# Patient Record
Sex: Male | Born: 1960 | Race: White | Hispanic: No | Marital: Married
Health system: Midwestern US, Academic
[De-identification: ages and names within clinical notes are randomized; demographics above are authoritative.]

---

## 2018-01-01 ENCOUNTER — Encounter: Admit: 2018-01-01 | Discharge: 2018-01-02 | Payer: BC Managed Care – PPO

## 2018-02-19 ENCOUNTER — Encounter: Admit: 2018-02-19 | Discharge: 2018-02-19 | Payer: BC Managed Care – PPO

## 2018-02-19 DIAGNOSIS — M109 Gout, unspecified: ICD-10-CM

## 2018-02-19 DIAGNOSIS — L719 Rosacea, unspecified: ICD-10-CM

## 2018-02-19 DIAGNOSIS — I2510 Atherosclerotic heart disease of native coronary artery without angina pectoris: Principal | ICD-10-CM

## 2018-02-19 DIAGNOSIS — I10 Essential (primary) hypertension: ICD-10-CM

## 2018-02-19 DIAGNOSIS — E781 Pure hyperglyceridemia: ICD-10-CM

## 2018-02-19 DIAGNOSIS — M549 Dorsalgia, unspecified: Principal | ICD-10-CM

## 2018-02-24 ENCOUNTER — Encounter: Admit: 2018-02-24 | Discharge: 2018-02-24 | Payer: BC Managed Care – PPO

## 2018-02-24 DIAGNOSIS — M109 Gout, unspecified: ICD-10-CM

## 2018-02-24 DIAGNOSIS — E781 Pure hyperglyceridemia: ICD-10-CM

## 2018-02-24 DIAGNOSIS — I10 Essential (primary) hypertension: ICD-10-CM

## 2018-02-24 DIAGNOSIS — L719 Rosacea, unspecified: ICD-10-CM

## 2018-02-24 DIAGNOSIS — I2510 Atherosclerotic heart disease of native coronary artery without angina pectoris: Principal | ICD-10-CM

## 2018-02-25 ENCOUNTER — Ambulatory Visit: Admit: 2018-02-25 | Discharge: 2018-02-25 | Payer: BC Managed Care – PPO

## 2018-02-25 ENCOUNTER — Encounter: Admit: 2018-02-25 | Discharge: 2018-02-25 | Payer: BC Managed Care – PPO

## 2018-02-25 DIAGNOSIS — M5416 Radiculopathy, lumbar region: ICD-10-CM

## 2018-02-25 DIAGNOSIS — M549 Dorsalgia, unspecified: Principal | ICD-10-CM

## 2018-02-25 DIAGNOSIS — M109 Gout, unspecified: ICD-10-CM

## 2018-02-25 DIAGNOSIS — Z9889 Other specified postprocedural states: ICD-10-CM

## 2018-02-25 DIAGNOSIS — I10 Essential (primary) hypertension: ICD-10-CM

## 2018-02-25 DIAGNOSIS — M4800 Spinal stenosis, site unspecified: ICD-10-CM

## 2018-02-25 DIAGNOSIS — L719 Rosacea, unspecified: ICD-10-CM

## 2018-02-25 DIAGNOSIS — I219 Acute myocardial infarction, unspecified: ICD-10-CM

## 2018-02-25 DIAGNOSIS — M5136 Other intervertebral disc degeneration, lumbar region: ICD-10-CM

## 2018-02-25 DIAGNOSIS — M5030 Other cervical disc degeneration, unspecified cervical region: ICD-10-CM

## 2018-02-25 DIAGNOSIS — I2510 Atherosclerotic heart disease of native coronary artery without angina pectoris: Principal | ICD-10-CM

## 2018-02-25 DIAGNOSIS — E781 Pure hyperglyceridemia: ICD-10-CM

## 2018-02-25 DIAGNOSIS — M48062 Spinal stenosis, lumbar region with neurogenic claudication: ICD-10-CM

## 2018-02-25 DIAGNOSIS — M2550 Pain in unspecified joint: ICD-10-CM

## 2018-04-13 ENCOUNTER — Ambulatory Visit: Admit: 2018-04-13 | Discharge: 2018-04-14 | Payer: BC Managed Care – PPO

## 2018-04-13 ENCOUNTER — Encounter: Admit: 2018-04-13 | Discharge: 2018-04-13 | Payer: BC Managed Care – PPO

## 2018-04-13 DIAGNOSIS — E781 Pure hyperglyceridemia: ICD-10-CM

## 2018-04-13 DIAGNOSIS — M5030 Other cervical disc degeneration, unspecified cervical region: ICD-10-CM

## 2018-04-13 DIAGNOSIS — I10 Essential (primary) hypertension: ICD-10-CM

## 2018-04-13 DIAGNOSIS — M2550 Pain in unspecified joint: ICD-10-CM

## 2018-04-13 DIAGNOSIS — M4800 Spinal stenosis, site unspecified: ICD-10-CM

## 2018-04-13 DIAGNOSIS — L719 Rosacea, unspecified: ICD-10-CM

## 2018-04-13 DIAGNOSIS — M109 Gout, unspecified: ICD-10-CM

## 2018-04-13 DIAGNOSIS — M5136 Other intervertebral disc degeneration, lumbar region: ICD-10-CM

## 2018-04-13 DIAGNOSIS — I219 Acute myocardial infarction, unspecified: ICD-10-CM

## 2018-04-13 DIAGNOSIS — I2510 Atherosclerotic heart disease of native coronary artery without angina pectoris: Principal | ICD-10-CM

## 2018-04-14 DIAGNOSIS — M48062 Spinal stenosis, lumbar region with neurogenic claudication: ICD-10-CM

## 2018-04-14 DIAGNOSIS — Z9889 Other specified postprocedural states: Principal | ICD-10-CM

## 2018-04-14 DIAGNOSIS — M109 Gout, unspecified: ICD-10-CM

## 2018-04-14 DIAGNOSIS — M5416 Radiculopathy, lumbar region: ICD-10-CM

## 2021-06-08 ENCOUNTER — Encounter: Admit: 2021-06-08 | Discharge: 2021-06-08 | Payer: BC Managed Care – PPO

## 2021-06-11 ENCOUNTER — Encounter: Admit: 2021-06-11 | Discharge: 2021-06-11 | Payer: BC Managed Care – PPO

## 2021-06-28 ENCOUNTER — Encounter: Admit: 2021-06-28 | Discharge: 2021-06-28 | Payer: BC Managed Care – PPO

## 2021-06-28 DIAGNOSIS — G959 Disease of spinal cord, unspecified: Secondary | ICD-10-CM

## 2021-07-10 ENCOUNTER — Inpatient Hospital Stay: Admit: 2021-07-10 | Discharge: 2021-07-10 | Payer: BC Managed Care – PPO

## 2021-07-10 ENCOUNTER — Ambulatory Visit: Admit: 2021-07-10 | Discharge: 2021-07-10 | Payer: BC Managed Care – PPO

## 2021-07-10 ENCOUNTER — Encounter: Admit: 2021-07-10 | Discharge: 2021-07-10 | Payer: BC Managed Care – PPO

## 2021-07-10 DIAGNOSIS — G959 Disease of spinal cord, unspecified: Secondary | ICD-10-CM

## 2021-07-10 DIAGNOSIS — I2510 Atherosclerotic heart disease of native coronary artery without angina pectoris: Secondary | ICD-10-CM

## 2021-07-10 DIAGNOSIS — M109 Gout, unspecified: Secondary | ICD-10-CM

## 2021-07-10 DIAGNOSIS — M5030 Other cervical disc degeneration, unspecified cervical region: Secondary | ICD-10-CM

## 2021-07-10 DIAGNOSIS — M2550 Pain in unspecified joint: Secondary | ICD-10-CM

## 2021-07-10 DIAGNOSIS — I10 Essential (primary) hypertension: Secondary | ICD-10-CM

## 2021-07-10 DIAGNOSIS — T148XXA Other injury of unspecified body region, initial encounter: Secondary | ICD-10-CM

## 2021-07-10 DIAGNOSIS — Z981 Arthrodesis status: Secondary | ICD-10-CM

## 2021-07-10 DIAGNOSIS — I219 Acute myocardial infarction, unspecified: Secondary | ICD-10-CM

## 2021-07-10 DIAGNOSIS — M4802 Spinal stenosis, cervical region: Secondary | ICD-10-CM

## 2021-07-10 DIAGNOSIS — E781 Pure hyperglyceridemia: Secondary | ICD-10-CM

## 2021-07-10 DIAGNOSIS — L719 Rosacea, unspecified: Secondary | ICD-10-CM

## 2021-07-10 DIAGNOSIS — M5136 Other intervertebral disc degeneration, lumbar region: Secondary | ICD-10-CM

## 2021-07-10 DIAGNOSIS — M4800 Spinal stenosis, site unspecified: Secondary | ICD-10-CM

## 2021-07-10 NOTE — Progress Notes
Two patient identifiers confirmed. Patient scheduled for [facility] telehealth appointment on 07/12/2021 at 0730 for procedure scheduled on 07/27/2021 with Dr. Alma Friendly.    Patient has history of CAD, degenerative disc disease, HTN, hyperlipidemia, spinal stenosis, MI, and CABG..    No medication instructions were given to patient.    Patient verbalized understanding of instructions and date of telehealth appointment. All questions answered. Clinic information and appointment confirmation sent to patient via MyChart.

## 2021-07-10 NOTE — Progress Notes
Date of Service: 07/10/2021        Chief complaint    Chief Complaint   Patient presents with   ? Neck Pain     New patient with cervical pain    ? Pain     with pain he has drop foot with the right ankle. More aching feeling       HPI  Brian Peterson is a 61 y.o. male new patient presents with symptoms consistent with progressive gait disorder.  He describes occultly with balance/gait that have progressed within the last couple years significantly.  He finds himself limiting his mobility because of this.  He has a history of a previous ACDF done back in 2000 and reports postoperative foot drop on the right as a result of this.  He now feels his left lower extremity is weakening.  He did report some improvement in his upper extremity symptoms immediately after surgery however he was left with significant limitations in regards to his strength particularly on the right.  He feels this has worsened within the last 3 to 4 years as well.  He has had extensive physical therapy and tried injections in the past.  Also tried various NSAIDs, muscle relaxers, heat, ice in addition to rest.  Symptoms are worse with standing and walking and overall improved somewhat with rest/laying down.                  Oswestry: Oswestry Total Score:: (P) 40       PMH    Medical History:   Diagnosis Date   ? CAD (coronary artery disease)    ? Degenerative disc disease, cervical    ? Degenerative disc disease, lumbar    ? Gout    ? Heart attack (HCC)    ? High triglycerides    ? Hypertension    ? Joint pain    ? Nerve injury 05/20/1998    cervical fusions   ? Rosacea    ? Spinal stenosis            ROS  Review of Systems       FH    Family History   Problem Relation Age of Onset   ? Hypertension Mother    ? Back pain Mother    ? Hypertension Brother    ? Heart Surgery Brother    ? Back pain Brother    ? Back pain Maternal Grandfather    ? Heart problem Brother    ? Back pain Brother    ? Heart problem Brother    ? Stroke Brother      Social History     Socioeconomic History   ? Marital status: Married   Occupational History   ? Occupation: Architectural technologist   Tobacco Use   ? Smoking status: Never   ? Smokeless tobacco: Never   Substance and Sexual Activity   ? Alcohol use: Yes     Alcohol/week: 1.0 standard drink     Types: 1 Cans of beer per week   ? Drug use: Never   ? Sexual activity: Yes     Partners: Female     Birth control/protection: None           SH    Surgical History:   Procedure Laterality Date   ? HX CERVICAL FUSION  2000   ? HX LUMBAR DISKECTOMY  2012   ? HX CHOLECYSTECTOMY  2015   ? HX CORONARY ARTERY  BYPASS GRAFT  2017   ? UMBILICAL ARTERIAL CATH - BEDSIDE  02/2016   ? LAMINECTOMY  2010, 2011    L3-4, L4-5       Meds    ? allopurinol (ZYLOPRIM) 100 mg tablet Take 100 mg by mouth daily. Take with food.   ? amLODIPine (NORVASC) 10 mg tablet Take 10 mg by mouth daily.   ? aspirin 81 mg chewable tablet Chew one tablet by mouth daily. Take with food.   ? chlorthalidone (HYGROTON) 25 mg tablet Take one tablet by mouth every morning.   ? losartan(+) (COZAAR) 100 mg tablet Take one tablet by mouth daily.   ? metoprolol tartrate (LOPRESSOR) 25 mg tablet Take 25 mg by mouth twice daily.   ? MULTIVITAMIN PO Take  by mouth daily.       Allergies    No Known Allergies      Exam  Physical Exam   Constitutional: he is oriented to person, place, and time. he appears well-developed and well-nourished.    Head: Normocephalic and atraumatic.    Eyes: Conjunctivae and EOM are normal.    Pulmonary/Chest: Effort normal. No respiratory distress.   Neurological: he is alert and oriented to person, place, and time. No cranial nerve deficit or sensory deficit. he exhibits normal muscle tone. Coordination normal.   Skin: Skin is warm and dry.   Psychiatric: he has a normal mood and affect. his behavior is normal. Judgment and thought content normal.    Vitals reviewed.  The patient alert and oriented and in no acute distress.  Gait is steady/well-balanced.    Patient is unable to tiptoe/heel walk noting foot drop bilaterally.  Lumbosacral region skin dry and intact with no palpable irregularities or muscle spasm.  Range of motion: Flexes to the   Hip rotation free and painless bilaterally.  Seated straight leg raise negative bilaterally at 90 degrees, no root tension signs.    No calf tenderness or clonus.  Bilateral Hoffmann's noted.  Motor strength:   Motor exam reveals weakness in the anterior tibialis bilaterally (right greater than left) otherwise generalized weakness in the lower extremities in all other groups.  Significant weakness in the upper extremities noted particularly on the right involving grips in addition to finger Abduction      Vitals:   There were no vitals filed for this visit.  There is no height or weight on file to calculate BMI.      Imaging:   I independently reviewed the patient's imaging findings:  X-rays show no acute findings.  Postop ACDF change C5-7 noted with no obvious acute findings.  He has degenerative changes at C4-5 and C3-4.  Cervical MRI shows postop change C5-7 ACDF.  He has gone on to develop significant stenosis proximally at C4-5 and C3-4.  Cord signal changes noted at the C6-7 level in addition to atrophy of the cord.    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  Recommended therapy and surgical intervention in attempt to improve his symptoms/quality of life especially in regards to his mobility.  Describes progressive gait issues that have worsened over the last year or 2 significantly and reports significant functional limitations in his mobility because of this.  He continues to have weakness in the bilateral  and upper extremities which have worsened over the last year or so as well.  We commended posterior cervical laminectomy from C3-7 followed by posterior spinal instrumented fusion C3-7.  Patient has had an adequate trial of > 12 month of rest, exercise, multimodal treatment, and the passage of time without improvement of symptoms. The pain has significant impact on the daily quality of life.   The risks and benefits of surgery were explained in detail to the patient which included, but certainly were not limited to: bleeding, infection, nerve or vessel damage, scar, pain, risks of anesthesia, death, need for further surgery, iatrogenic instability, heart attack, stroke, massive bleeding, coma death. The patient understands the risks of the procedure and elects to proceed.  The patient is agreeable to the above treatment plan.  Patient would like to proceed with surgical intervention at this time and attempt to improve his quality life.  Several questions were answered and they were encouraged to contact us if symptoms changed/worsen or if they have any further questions.                                     No orders of the defined types were placed in this encounter.                             Please note that documentation of records were done during a busy neurosurgical clinic. Attempts have been made to review the document for any errors. Please excuse for brevity and typographical errors.

## 2021-07-11 ENCOUNTER — Encounter: Admit: 2021-07-11 | Discharge: 2021-07-11 | Payer: BC Managed Care – PPO

## 2021-07-11 MED FILL — PRESURGERY KIT B: 1 days supply | Qty: 1 | Fill #1 | Status: AC

## 2021-07-12 ENCOUNTER — Encounter: Admit: 2021-07-12 | Discharge: 2021-07-12 | Payer: BC Managed Care – PPO

## 2021-07-12 ENCOUNTER — Ambulatory Visit: Admit: 2021-07-12 | Discharge: 2021-07-13 | Payer: BC Managed Care – PPO

## 2021-07-12 DIAGNOSIS — M4800 Spinal stenosis, site unspecified: Secondary | ICD-10-CM

## 2021-07-12 DIAGNOSIS — M5136 Other intervertebral disc degeneration, lumbar region: Secondary | ICD-10-CM

## 2021-07-12 DIAGNOSIS — I219 Acute myocardial infarction, unspecified: Secondary | ICD-10-CM

## 2021-07-12 DIAGNOSIS — M5030 Other cervical disc degeneration, unspecified cervical region: Secondary | ICD-10-CM

## 2021-07-12 DIAGNOSIS — T148XXA Other injury of unspecified body region, initial encounter: Secondary | ICD-10-CM

## 2021-07-12 DIAGNOSIS — Z01818 Encounter for other preprocedural examination: Secondary | ICD-10-CM

## 2021-07-12 DIAGNOSIS — G959 Disease of spinal cord, unspecified: Secondary | ICD-10-CM

## 2021-07-12 DIAGNOSIS — M109 Gout, unspecified: Secondary | ICD-10-CM

## 2021-07-12 DIAGNOSIS — L719 Rosacea, unspecified: Secondary | ICD-10-CM

## 2021-07-12 DIAGNOSIS — I10 Essential (primary) hypertension: Secondary | ICD-10-CM

## 2021-07-12 DIAGNOSIS — I2510 Atherosclerotic heart disease of native coronary artery without angina pectoris: Secondary | ICD-10-CM

## 2021-07-12 DIAGNOSIS — G4733 Obstructive sleep apnea (adult) (pediatric): Secondary | ICD-10-CM

## 2021-07-12 DIAGNOSIS — K219 Gastro-esophageal reflux disease without esophagitis: Secondary | ICD-10-CM

## 2021-07-12 DIAGNOSIS — M2550 Pain in unspecified joint: Secondary | ICD-10-CM

## 2021-07-12 DIAGNOSIS — M4802 Spinal stenosis, cervical region: Principal | ICD-10-CM

## 2021-07-12 DIAGNOSIS — E781 Pure hyperglyceridemia: Secondary | ICD-10-CM

## 2021-07-12 NOTE — Pre-Anesthesia Patient Instructions
GENERAL INFORMATION    Before you come to the hospital  If you are having an outpatient procedure, you will need to arrange for a responsible ride/person to accompany you home due to sedation or anesthesia with your procedure. A responsible person is a person who has the ability to identify a change in the patient's status and notify medical personnel.  This is typically a family member or friend.  Public transportation is permitted if you have a responsible person to accompany you.  An [employer] Goad, taxi or other public transportation driver is not considered a responsible person to accompany you home.  Bath/Shower Instructions  Put on clean clothes after bath or shower.  Avoid using lotion and oils.  If you are having surgery above the waist, wear a shirt that fastens up the front.  Sleep on clean sheets if bath or shower is done the night before procedure.  Wash using the antibacterial wipes you received in your pre-surgery kit as directed.  Leave money, credit cards, jewelry, and any other valuables at home. The [facility] is not responsible for the loss or breakage of personal items.  Remove nail polish, makeup and all jewelry (including piercings) before coming to the hospital.  The morning of your procedure:  brush your teeth and tongue  do not smoke, vape, chew or use any tobacco products  do not shave the area where you will have surgery    What to bring to the hospital  ID/ Insurance Card  Copy of your Living Will, Advanced Directives, and/or Durable Power of Attorney.  If you have these documents, please bring them to the admissions office on the day of your surgery to be scanned into your records.  Small bag with a few personal belongings  CPAP/BiPAP machine (including all supplies)  Walker, cane, or motorized scooter  Cases for glasses/hearing aids/contact lens (bring solutions for contacts)  Dress in clean, loose, comfortable clothing     Preparing to get your medications at discharge  Your surgeon may prescribe you medications to take after your procedure.  If you would like the convenience of having your medications filled here at [facility] please do one of the following:  Go to [place] pharmacy after your [facility] appointment to put a credit card on file.  Call [place] pharmacy at [phone] (Monday-Friday 7am-9pm or Saturday and Sunday 9am-5pm) to put a credit card on file.  Bring a credit card or cash on the day of your procedure- please leave with a family member rather than bringing it into the preop area.     Eating or drinking before surgery  Follow the instructions you received in your pre-surgery kit re: nutrition drinks.  If you are diabetic, do NOT drink the carbohydrate nutritional drinks that may be included in your kit.     Other instructions  Notify your surgeon if:  you become ill with a cough, fever, sore throat, nausea, vomiting or flu-like symptoms  you have any open wounds/sores that are red, painful, draining, or are new since you last saw the doctor  you need to cancel your procedure    On the day of your procedure, notify us at [place]: [phone]  if you need to cancel your procedure  if you are going to be late    Arrival at the hospital  [address]  [address]  [place], [place] [address]    Park in the P5 parking garage located at [employer], [place]  [place], [place] [address].   If parking in the P5 garage, take the east elevators in the parking garage to the second level and walk to the entrance of the [employer].    Enter through the 1st floor main entrance and check in with Information Desk.    You will receive a call with your surgery arrival time between 2:30pm and 4:30pm the last business day before your procedure.  If you do not receive a call, please call [phone] before 4:30pm or [phone] after 4:30pm.  Phone carriers that use spam blockers will sometimes block our phone numbers. If your phone contact number is a mobile phone, please adjust your settings to make sure you receive our call.  In your phone settings, turn OFF the setting ?silence unknown callers.?  Please add these phone numbers to your contacts [phone] & [phone]          For the safety of all patients, visitors and staff as we work to contain COVID-19, we must restrict patient visitors.    Current Visitor Policy (12/18/20):    Our current, and ongoing, visitor rules in surgery and procedural areas are:    2 visitors per patient will be allowed to accompany the patient and wait in the Waiting Room.     Patients in inpatient and pediatric units, Emergency Department, ambulatory clinics and lab appointments may only have two visitors.       For inpatient stays, patients may have 2 visitors at a time at their bedside. The two visitors can change throughout the day, but no more than two at a time may be bedside.  The policy applies to [facility] [place] [facility] [place], [address], [place] and [place] campuses and clinics.    Exceptions include:  No visitors allowed for patients with active COVID-19 infections.  Children younger than age 14 are allowed to visit inpatients.  Two parents/guardians are allowed for surgical or procedural patients younger than 63 years old.  Adult inpatients in semiprivate rooms may have visitors, but visits should be coordinated so only two total visitors are in a room at a time due to space limitations.    Visitors must be free of fever and symptoms to be in our facilities. We ask visitors to follow these guidelines:  Wear a mask at all times, unless under the age of 2, have trouble breathing or are unconscious, incapacitated or otherwise unable to remove the cover without assistance.  Go directly to the nursing station in the unit you are visiting and do not linger in public areas.  Check in at the nursing station before going to the patient's room.  Maintain a physical distance of six feet from all others.  Follow elevator restrictions to four riding at a time - peak times are 6:30-7:30 a.m., noon and 6:30-7:30 p.m.  Be aware cafeteria peak times are 11 a.m. - 1 p.m.  Wash your hands frequently and cover your coughs and sneezes.    Thank you for participating in your Preoperative Assessment Clinic visit today.  If you have any changes to your health or hospitalizations between now and your surgery, please call us at [phone].

## 2021-07-13 ENCOUNTER — Encounter: Admit: 2021-07-13 | Discharge: 2021-07-13 | Payer: BC Managed Care – PPO

## 2021-07-13 NOTE — Telephone Encounter
Patient called as he thought he needed to have a UA done today as part of his labs. I informed him that it was not necessary as he did not show s/s of a UTI. Patient stated understanding.

## 2021-07-13 NOTE — Telephone Encounter
Called patient and let him know that his cardiologist will not allow him to be off the baby ASA prior to surgery but unfortunately, we require that he is prior to surgery. Patient states he has been taking it since his 2017 surgery. I informed him if he can't be off of the ASA we will need to cancel his surgery. Patient states he will reach out to them and let us know.

## 2021-07-23 ENCOUNTER — Encounter: Admit: 2021-07-23 | Discharge: 2021-07-23 | Payer: BC Managed Care – PPO

## 2021-07-23 NOTE — Telephone Encounter
Patient called and stated he could do Thursday, 3/9 for surgery. I have notified all appropriate parties.

## 2021-07-23 NOTE — Telephone Encounter
Spoke with patient to see if he would be able to move his surgery up a day from Friday to Thursday. Patient stated he will have to check and see but he doesn't think he will be able to. Patient will call be back and let me know.

## 2021-07-24 ENCOUNTER — Encounter: Admit: 2021-07-24 | Discharge: 2021-07-24 | Payer: BC Managed Care – PPO

## 2021-07-24 DIAGNOSIS — M5030 Other cervical disc degeneration, unspecified cervical region: Secondary | ICD-10-CM

## 2021-07-26 ENCOUNTER — Encounter: Admit: 2021-07-26 | Discharge: 2021-07-26 | Payer: BC Managed Care – PPO

## 2021-07-26 ENCOUNTER — Inpatient Hospital Stay: Admit: 2021-07-26 | Discharge: 2021-07-26 | Payer: BC Managed Care – PPO

## 2021-07-26 ENCOUNTER — Ambulatory Visit: Admit: 2021-07-26 | Discharge: 2021-07-26 | Payer: BC Managed Care – PPO

## 2021-07-26 MED ORDER — PROPOFOL 10 MG/ML IV EMUL 100 ML (INFUSION)(AM)(OR)
INTRAVENOUS | 0 refills | Status: DC
Start: 2021-07-26 — End: 2021-07-26
  Administered 2021-07-26: 14:00:00 130 ug/kg/min via INTRAVENOUS

## 2021-07-26 MED ORDER — MIDAZOLAM 1 MG/ML IJ SOLN
INTRAVENOUS | 0 refills | Status: DC
Start: 2021-07-26 — End: 2021-07-26
  Administered 2021-07-26: 14:00:00 2 mg via INTRAVENOUS

## 2021-07-26 MED ORDER — PHENYLEPHRINE 40 MCG/ML IN NS IV DRIP (STD CONC)
INTRAVENOUS | 0 refills | Status: DC
Start: 2021-07-26 — End: 2021-07-26
  Administered 2021-07-26 (×2): .4 ug/kg/min via INTRAVENOUS

## 2021-07-26 MED ORDER — HYDROMORPHONE (PF) 2 MG/ML IJ SYRG
INTRAVENOUS | 0 refills | Status: DC
Start: 2021-07-26 — End: 2021-07-26
  Administered 2021-07-26 (×3): .5 mg via INTRAVENOUS

## 2021-07-26 MED ORDER — EPHEDRINE SULFATE 50 MG/ML IV SOLN
INTRAVENOUS | 0 refills | Status: DC
Start: 2021-07-26 — End: 2021-07-26
  Administered 2021-07-26: 14:00:00 10 mg via INTRAVENOUS
  Administered 2021-07-26: 15:00:00 5 mg via INTRAVENOUS
  Administered 2021-07-26: 14:00:00 10 mg via INTRAVENOUS

## 2021-07-26 MED ORDER — ARTIFICIAL TEARS (PF) SINGLE DOSE DROPS GROUP
OPHTHALMIC | 0 refills | Status: DC
Start: 2021-07-26 — End: 2021-07-26
  Administered 2021-07-26: 14:00:00 2 [drp] via OPHTHALMIC

## 2021-07-26 MED ORDER — ELECTROLYTE-A IV SOLP
INTRAVENOUS | 0 refills | Status: DC
Start: 2021-07-26 — End: 2021-07-26
  Administered 2021-07-26: 14:00:00 via INTRAVENOUS

## 2021-07-26 MED ORDER — ONDANSETRON HCL (PF) 4 MG/2 ML IJ SOLN
INTRAVENOUS | 0 refills | Status: DC
Start: 2021-07-26 — End: 2021-07-26
  Administered 2021-07-26: 17:00:00 4 mg via INTRAVENOUS

## 2021-07-26 MED ORDER — FENTANYL CITRATE (PF) 50 MCG/ML IJ SOLN
INTRAVENOUS | 0 refills | Status: DC
Start: 2021-07-26 — End: 2021-07-26
  Administered 2021-07-26 (×2): 50 ug via INTRAVENOUS

## 2021-07-26 MED ORDER — PROPOFOL INJ 10 MG/ML IV VIAL
INTRAVENOUS | 0 refills | Status: DC
Start: 2021-07-26 — End: 2021-07-26
  Administered 2021-07-26: 14:00:00 100 mg via INTRAVENOUS

## 2021-07-26 MED ORDER — DEXAMETHASONE SODIUM PHOSPHATE 4 MG/ML IJ SOLN
INTRAVENOUS | 0 refills | Status: DC
Start: 2021-07-26 — End: 2021-07-26
  Administered 2021-07-26: 14:00:00 10 mg via INTRAVENOUS

## 2021-07-26 MED ORDER — LIDOCAINE (PF) 200 MG/10 ML (2 %) IJ SYRG
INTRAVENOUS | 0 refills | Status: DC
Start: 2021-07-26 — End: 2021-07-26
  Administered 2021-07-26: 14:00:00 100 mg via INTRAVENOUS

## 2021-07-26 MED ORDER — SUCCINYLCHOLINE CHLORIDE 20 MG/ML IJ SOLN
INTRAVENOUS | 0 refills | Status: DC
Start: 2021-07-26 — End: 2021-07-26
  Administered 2021-07-26: 14:00:00 120 mg via INTRAVENOUS

## 2021-07-26 MED ORDER — REMIFENTANYL 1000MCG IN NS 20ML (OR)
INTRAVENOUS | 0 refills | Status: DC
Start: 2021-07-26 — End: 2021-07-26
  Administered 2021-07-26 (×2): .07 ug/kg/min via INTRAVENOUS
  Administered 2021-07-26 (×2): .08 ug/kg/min via INTRAVENOUS

## 2021-07-26 MED ADMIN — CEFAZOLIN INJ 1GM IVP [210319]: 2 g | INTRAVENOUS | @ 14:00:00 | Stop: 2021-07-26 | NDC 00143992490

## 2021-07-26 MED ADMIN — SODIUM CHLORIDE 0.9 % IR SOLN [11403]: 1000 mL | @ 15:00:00 | Stop: 2021-07-26 | NDC 00338004804

## 2021-07-26 MED ADMIN — OXYCODONE 5 MG PO TAB [10814]: 10 mg | ORAL | @ 21:00:00 | Stop: 2021-07-28 | NDC 42858000110

## 2021-07-26 MED ADMIN — OXYCODONE 5 MG PO TAB [10814]: 5 mg | ORAL | @ 18:00:00 | Stop: 2021-07-28 | NDC 42858000110

## 2021-07-26 MED ADMIN — THROMBIN (BOVINE) 5,000 UNIT TP SOLR [164515]: 5000 [IU] | TOPICAL | @ 15:00:00 | Stop: 2021-07-26 | NDC 60793031501

## 2021-07-26 MED ADMIN — CELECOXIB 200 MG PO CAP [76958]: 200 mg | ORAL | @ 13:00:00 | Stop: 2021-07-26 | NDC 00904650361

## 2021-07-26 MED ADMIN — SODIUM CHLORIDE 0.9 % IV SOLP [27838]: 1000.000 mL | INTRAVENOUS | @ 18:00:00 | Stop: 2021-07-28 | NDC 00338004904

## 2021-07-26 MED ADMIN — METHOCARBAMOL 750 MG PO TAB [4972]: 750 mg | ORAL | @ 18:00:00 | NDC 70010077005

## 2021-07-26 MED ADMIN — LACTATED RINGERS IV SOLP [4318]: 1000.000 mL | INTRAVENOUS | @ 13:00:00 | Stop: 2021-07-28 | NDC 00338011704

## 2021-07-26 MED ADMIN — CEFAZOLIN INJ 1GM IVP [210319]: 2 g | INTRAVENOUS | @ 21:00:00 | Stop: 2021-07-27 | NDC 60505614200

## 2021-07-26 MED ADMIN — BUPIVACAINE-EPINEPHRINE 0.5 %-1:200,000 IJ SOLN [14984]: 10 mL | INTRAMUSCULAR | @ 15:00:00 | Stop: 2021-07-26 | NDC 63323046301

## 2021-07-26 MED ADMIN — OXYCODONE 5 MG PO TAB [10814]: 5 mg | ORAL | @ 18:00:00 | Stop: 2021-07-26 | NDC 42858000110

## 2021-07-26 MED ADMIN — VANCOMYCIN 1,000 MG IV SOLR [8442]: 1 g | TOPICAL | @ 16:00:00 | Stop: 2021-07-26 | NDC 00409653311

## 2021-07-26 MED ADMIN — ACETAMINOPHEN 500 MG PO TAB [102]: 1000 mg | ORAL | @ 13:00:00 | Stop: 2021-07-26 | NDC 00904673061

## 2021-07-26 MED ADMIN — FENTANYL CITRATE (PF) 50 MCG/ML IJ SOLN [3037]: 50 ug | INTRAVENOUS | @ 18:00:00 | Stop: 2021-07-26 | NDC 00641602701

## 2021-07-26 MED ADMIN — CEFAZOLIN 1 GRAM IJ SOLR [1445]: 1000 mL | @ 15:00:00 | Stop: 2021-07-26 | NDC 00143992490

## 2021-07-27 ENCOUNTER — Inpatient Hospital Stay: Admit: 2021-07-27 | Discharge: 2021-07-27 | Payer: BC Managed Care – PPO

## 2021-07-27 ENCOUNTER — Encounter: Admit: 2021-07-27 | Discharge: 2021-07-27 | Payer: BC Managed Care – PPO

## 2021-07-27 MED ADMIN — OXYCODONE 5 MG PO TAB [10814]: 15 mg | ORAL | @ 15:00:00 | Stop: 2021-07-28 | NDC 42858000110

## 2021-07-27 MED ADMIN — METHOCARBAMOL 750 MG PO TAB [4972]: 750 mg | ORAL | NDC 70010077005

## 2021-07-27 MED ADMIN — METHOCARBAMOL 750 MG PO TAB [4972]: 750 mg | ORAL | @ 23:00:00 | NDC 70010077005

## 2021-07-27 MED ADMIN — POTASSIUM CHLORIDE 20 MEQ PO TBTQ [35943]: 20 meq | ORAL | @ 23:00:00 | NDC 00832532511

## 2021-07-27 MED ADMIN — CALCIUM CARBONATE 500 MG CALCIUM (1,250 MG) PO TAB [1300]: 1250 mg | ORAL | @ 21:00:00 | NDC 00904188361

## 2021-07-27 MED ADMIN — OXYCODONE 5 MG PO TAB [10814]: 5 mg | ORAL | @ 03:00:00 | Stop: 2021-07-28 | NDC 42858000110

## 2021-07-27 MED ADMIN — METHOCARBAMOL 750 MG PO TAB [4972]: 750 mg | ORAL | @ 05:00:00 | NDC 70010077005

## 2021-07-27 MED ADMIN — ALLOPURINOL 100 MG PO TAB [310]: 300 mg | ORAL | @ 21:00:00 | NDC 00904704161

## 2021-07-27 MED ADMIN — MAGNESIUM HYDROXIDE 400 MG/5 ML PO SUSP [79944]: 30 mL | ORAL | NDC 00121043130

## 2021-07-27 MED ADMIN — DOCUSATE SODIUM 100 MG PO CAP [2566]: 100 mg | ORAL | @ 15:00:00 | NDC 00904718361

## 2021-07-27 MED ADMIN — OXYCODONE 5 MG PO TAB [10814]: 10 mg | ORAL | @ 06:00:00 | Stop: 2021-07-28 | NDC 42858000110

## 2021-07-27 MED ADMIN — METHOCARBAMOL 750 MG PO TAB [4972]: 750 mg | ORAL | @ 18:00:00 | NDC 70010077005

## 2021-07-27 MED ADMIN — ACETAMINOPHEN 500 MG PO TAB [102]: 1000 mg | ORAL | @ 11:00:00 | Stop: 2021-07-28 | NDC 00904673061

## 2021-07-27 MED ADMIN — CELECOXIB 100 MG PO CAP [82263]: 200 mg | ORAL | @ 03:00:00 | Stop: 2021-07-29 | NDC 00904650261

## 2021-07-27 MED ADMIN — ACETAMINOPHEN 500 MG PO TAB [102]: 1000 mg | ORAL | Stop: 2021-07-28 | NDC 00904673061

## 2021-07-27 MED ADMIN — CHLORTHALIDONE 25 MG PO TAB [1661]: 25 mg | ORAL | @ 21:00:00 | NDC 00904690061

## 2021-07-27 MED ADMIN — CELECOXIB 100 MG PO CAP [82263]: 200 mg | ORAL | @ 15:00:00 | Stop: 2021-07-29 | NDC 00904650261

## 2021-07-27 MED ADMIN — OXYCODONE 5 MG PO TAB [10814]: 5 mg | ORAL | @ 18:00:00 | Stop: 2021-07-28 | NDC 42858000110

## 2021-07-27 MED ADMIN — METHOCARBAMOL 750 MG PO TAB [4972]: 750 mg | ORAL | @ 11:00:00 | NDC 70010077005

## 2021-07-27 MED ADMIN — ACETAMINOPHEN 500 MG PO TAB [102]: 1000 mg | ORAL | @ 21:00:00 | Stop: 2021-07-28 | NDC 00904673061

## 2021-07-27 MED ADMIN — SODIUM CHLORIDE 0.9 % IV SOLP [27838]: 1000.000 mL | INTRAVENOUS | @ 16:00:00 | Stop: 2021-07-28 | NDC 00338004904

## 2021-07-27 MED ADMIN — MAGNESIUM HYDROXIDE 400 MG/5 ML PO SUSP [79944]: 30 mL | ORAL | @ 15:00:00 | NDC 00121043130

## 2021-07-27 MED ADMIN — LACTATED RINGERS IV SOLP [4318]: 1000.000 mL | INTRAVENOUS | @ 05:00:00 | Stop: 2021-07-28 | NDC 00338011704

## 2021-07-27 MED ADMIN — CEFAZOLIN INJ 1GM IVP [210319]: 2 g | INTRAVENOUS | @ 05:00:00 | Stop: 2021-07-27 | NDC 60505614200

## 2021-07-27 MED ADMIN — ACETAMINOPHEN 500 MG PO TAB [102]: 1000 mg | ORAL | @ 05:00:00 | Stop: 2021-07-28 | NDC 00904673061

## 2021-07-27 MED ADMIN — OXYCODONE 5 MG PO TAB [10814]: 10 mg | ORAL | @ 10:00:00 | Stop: 2021-07-28 | NDC 42858000110

## 2021-07-27 MED ADMIN — POLYETHYLENE GLYCOL 3350 17 GRAM PO PWPK [25424]: 17 g | ORAL | @ 15:00:00 | NDC 00904693186

## 2021-07-27 MED ADMIN — AMLODIPINE 5 MG PO TAB [79041]: 5 mg | ORAL | @ 21:00:00 | NDC 00904637061

## 2021-07-28 ENCOUNTER — Encounter: Admit: 2021-07-28 | Discharge: 2021-07-28 | Payer: BC Managed Care – PPO

## 2021-07-28 DIAGNOSIS — I2510 Atherosclerotic heart disease of native coronary artery without angina pectoris: Secondary | ICD-10-CM

## 2021-07-28 DIAGNOSIS — M2550 Pain in unspecified joint: Secondary | ICD-10-CM

## 2021-07-28 DIAGNOSIS — M5030 Other cervical disc degeneration, unspecified cervical region: Secondary | ICD-10-CM

## 2021-07-28 DIAGNOSIS — M4800 Spinal stenosis, site unspecified: Secondary | ICD-10-CM

## 2021-07-28 DIAGNOSIS — E781 Pure hyperglyceridemia: Secondary | ICD-10-CM

## 2021-07-28 DIAGNOSIS — I219 Acute myocardial infarction, unspecified: Secondary | ICD-10-CM

## 2021-07-28 DIAGNOSIS — I10 Essential (primary) hypertension: Secondary | ICD-10-CM

## 2021-07-28 DIAGNOSIS — T148XXA Other injury of unspecified body region, initial encounter: Secondary | ICD-10-CM

## 2021-07-28 DIAGNOSIS — K219 Gastro-esophageal reflux disease without esophagitis: Secondary | ICD-10-CM

## 2021-07-28 DIAGNOSIS — M5136 Other intervertebral disc degeneration, lumbar region: Secondary | ICD-10-CM

## 2021-07-28 DIAGNOSIS — L719 Rosacea, unspecified: Secondary | ICD-10-CM

## 2021-07-28 DIAGNOSIS — M109 Gout, unspecified: Secondary | ICD-10-CM

## 2021-07-28 DIAGNOSIS — G4733 Obstructive sleep apnea (adult) (pediatric): Secondary | ICD-10-CM

## 2021-07-28 MED ADMIN — METHOCARBAMOL 750 MG PO TAB [4972]: 750 mg | ORAL | NDC 70010077005

## 2021-07-28 MED ADMIN — SODIUM CHLORIDE 0.9 % IV SOLP [27838]: 1000.000 mL | INTRAVENOUS | Stop: 2021-07-28 | NDC 00338004904

## 2021-07-28 MED ADMIN — CELECOXIB 100 MG PO CAP [82263]: 200 mg | ORAL | @ 03:00:00 | Stop: 2021-07-29 | NDC 00904650261

## 2021-07-28 MED ADMIN — TAMSULOSIN 0.4 MG PO CAP [80077]: 0.4 mg | ORAL | @ 03:00:00 | NDC 68084029911

## 2021-07-28 MED ADMIN — HEPARIN, PORCINE (PF) 5,000 UNIT/0.5 ML IJ SYRG [95535]: 5000 [IU] | SUBCUTANEOUS | @ 04:00:00 | NDC 00409131611

## 2021-07-28 MED ADMIN — POLYETHYLENE GLYCOL 3350 17 GRAM PO PWPK [25424]: 17 g | ORAL | @ 15:00:00 | NDC 00904693186

## 2021-07-28 MED ADMIN — ALLOPURINOL 100 MG PO TAB [310]: 300 mg | ORAL | @ 15:00:00 | NDC 00904704161

## 2021-07-28 MED ADMIN — OXYCODONE 5 MG PO TAB [10814]: 5 mg | ORAL | @ 10:00:00 | Stop: 2021-07-28 | NDC 42858000110

## 2021-07-28 MED ADMIN — METHOCARBAMOL 750 MG PO TAB [4972]: 750 mg | ORAL | @ 18:00:00 | NDC 70010077005

## 2021-07-28 MED ADMIN — AMLODIPINE 5 MG PO TAB [79041]: 5 mg | ORAL | @ 15:00:00 | NDC 00904637061

## 2021-07-28 MED ADMIN — DOCUSATE SODIUM 100 MG PO CAP [2566]: 100 mg | ORAL | @ 15:00:00 | NDC 00904718361

## 2021-07-28 MED ADMIN — OXYCODONE 5 MG PO TAB [10814]: 5 mg | ORAL | @ 19:00:00 | NDC 42858000110

## 2021-07-28 MED ADMIN — METHOCARBAMOL 750 MG PO TAB [4972]: 750 mg | ORAL | @ 05:00:00 | NDC 70010077005

## 2021-07-28 MED ADMIN — OXYCODONE 5 MG PO TAB [10814]: 5 mg | ORAL | @ 07:00:00 | Stop: 2021-07-28 | NDC 42858000110

## 2021-07-28 MED ADMIN — ACETAMINOPHEN 500 MG PO TAB [102]: 1000 mg | ORAL | @ 04:00:00 | Stop: 2021-07-28 | NDC 00904673061

## 2021-07-28 MED ADMIN — POTASSIUM CHLORIDE 20 MEQ PO TBTQ [35943]: 20 meq | ORAL | @ 15:00:00 | NDC 00832532511

## 2021-07-28 MED ADMIN — CALCIUM CARBONATE 500 MG CALCIUM (1,250 MG) PO TAB [1300]: 1250 mg | ORAL | @ 15:00:00 | NDC 00904188361

## 2021-07-28 MED ADMIN — CARVEDILOL 12.5 MG PO TAB [77424]: 12.5 mg | ORAL | @ 03:00:00 | NDC 00904630261

## 2021-07-28 MED ADMIN — POTASSIUM CHLORIDE 20 MEQ PO TBTQ [35943]: 20 meq | ORAL | NDC 00832532511

## 2021-07-28 MED ADMIN — MAGNESIUM HYDROXIDE 400 MG/5 ML PO SUSP [79944]: 30 mL | ORAL | @ 15:00:00 | NDC 66689005301

## 2021-07-28 MED ADMIN — LOSARTAN 50 MG PO TAB [76938]: 100 mg | ORAL | @ 03:00:00 | NDC 68084034711

## 2021-07-28 MED ADMIN — SODIUM CHLORIDE 0.9 % IV SOLP [27838]: 1000.000 mL | INTRAVENOUS | @ 11:00:00 | Stop: 2021-07-28 | NDC 00338004904

## 2021-07-28 MED ADMIN — OXYCODONE 5 MG PO TAB [10814]: 5 mg | ORAL | @ 22:00:00 | NDC 42858000110

## 2021-07-28 MED ADMIN — OXYCODONE 5 MG PO TAB [10814]: 5 mg | ORAL | @ 15:00:00 | Stop: 2021-07-28 | NDC 42858000110

## 2021-07-28 MED ADMIN — CHLORTHALIDONE 25 MG PO TAB [1661]: 25 mg | ORAL | @ 15:00:00 | NDC 00904690061

## 2021-07-28 MED ADMIN — ACETAMINOPHEN 500 MG PO TAB [102]: 1000 mg | ORAL | @ 18:00:00 | NDC 00904673061

## 2021-07-28 MED ADMIN — METHOCARBAMOL 750 MG PO TAB [4972]: 750 mg | ORAL | @ 11:00:00 | NDC 70010077005

## 2021-07-28 MED ADMIN — DOCUSATE SODIUM 100 MG PO CAP [2566]: 100 mg | ORAL | @ 03:00:00 | NDC 00904718361

## 2021-07-28 MED ADMIN — CARVEDILOL 12.5 MG PO TAB [77424]: 12.5 mg | ORAL | @ 15:00:00 | NDC 00904630261

## 2021-07-28 MED ADMIN — ACETAMINOPHEN 500 MG PO TAB [102]: 1000 mg | ORAL | @ 11:00:00 | Stop: 2021-07-28 | NDC 00904673061

## 2021-07-28 MED ADMIN — CELECOXIB 100 MG PO CAP [82263]: 200 mg | ORAL | @ 15:00:00 | Stop: 2021-07-28 | NDC 00904650261

## 2021-07-28 MED ADMIN — LOSARTAN 50 MG PO TAB [76938]: 100 mg | ORAL | @ 15:00:00 | NDC 68084034711

## 2021-07-28 MED ADMIN — POLYETHYLENE GLYCOL 3350 17 GRAM PO PWPK [25424]: 17 g | ORAL | @ 03:00:00 | NDC 00904693186

## 2021-07-28 MED ADMIN — HEPARIN, PORCINE (PF) 5,000 UNIT/0.5 ML IJ SYRG [95535]: 5000 [IU] | SUBCUTANEOUS | @ 11:00:00 | NDC 00409131611

## 2021-07-28 MED ADMIN — HEPARIN, PORCINE (PF) 5,000 UNIT/0.5 ML IJ SYRG [95535]: 5000 [IU] | SUBCUTANEOUS | @ 21:00:00 | NDC 00409131611

## 2021-07-29 ENCOUNTER — Encounter: Admit: 2021-07-29 | Discharge: 2021-07-29 | Payer: BC Managed Care – PPO

## 2021-07-29 MED ADMIN — OXYCODONE 5 MG PO TAB [10814]: 5 mg | ORAL | @ 13:00:00 | Stop: 2021-07-29 | NDC 42858000110

## 2021-07-29 MED ADMIN — LOSARTAN 50 MG PO TAB [76938]: 100 mg | ORAL | @ 13:00:00 | Stop: 2021-07-29 | NDC 68084034711

## 2021-07-29 MED ADMIN — CELECOXIB 100 MG PO CAP [82263]: 200 mg | ORAL | @ 13:00:00 | Stop: 2021-07-29 | NDC 00904650261

## 2021-07-29 MED ADMIN — CARVEDILOL 12.5 MG PO TAB [77424]: 12.5 mg | ORAL | @ 13:00:00 | Stop: 2021-07-29 | NDC 00904630261

## 2021-07-29 MED ADMIN — METHOCARBAMOL 750 MG PO TAB [4972]: 750 mg | ORAL | @ 05:00:00 | NDC 70010077005

## 2021-07-29 MED ADMIN — DOCUSATE SODIUM 100 MG PO CAP [2566]: 100 mg | ORAL | @ 02:00:00 | NDC 00904718361

## 2021-07-29 MED ADMIN — POLYETHYLENE GLYCOL 3350 17 GRAM PO PWPK [25424]: 17 g | ORAL | @ 02:00:00 | NDC 00904693186

## 2021-07-29 MED ADMIN — HEPARIN, PORCINE (PF) 5,000 UNIT/0.5 ML IJ SYRG [95535]: 5000 [IU] | SUBCUTANEOUS | @ 11:00:00 | Stop: 2021-07-29 | NDC 00409131611

## 2021-07-29 MED ADMIN — CARVEDILOL 12.5 MG PO TAB [77424]: 12.5 mg | ORAL | @ 02:00:00 | NDC 00904630261

## 2021-07-29 MED ADMIN — ALLOPURINOL 100 MG PO TAB [310]: 300 mg | ORAL | @ 13:00:00 | Stop: 2021-07-29 | NDC 00904704161

## 2021-07-29 MED ADMIN — OXYCODONE 5 MG PO TAB [10814]: 5 mg | ORAL | @ 05:00:00 | NDC 42858000110

## 2021-07-29 MED ADMIN — POTASSIUM CHLORIDE 20 MEQ PO TBTQ [35943]: 20 meq | ORAL | @ 13:00:00 | Stop: 2021-07-29 | NDC 00832532511

## 2021-07-29 MED ADMIN — OXYCODONE 5 MG PO TAB [10814]: 10 mg | ORAL | @ 16:00:00 | Stop: 2021-07-29 | NDC 42858000110

## 2021-07-29 MED ADMIN — CELECOXIB 100 MG PO CAP [82263]: 200 mg | ORAL | @ 02:00:00 | NDC 00904650261

## 2021-07-29 MED ADMIN — TAMSULOSIN 0.4 MG PO CAP [80077]: 0.4 mg | ORAL | @ 02:00:00 | NDC 68084029911

## 2021-07-29 MED ADMIN — AMLODIPINE 5 MG PO TAB [79041]: 5 mg | ORAL | @ 13:00:00 | Stop: 2021-07-29 | NDC 00904637061

## 2021-07-29 MED ADMIN — ACETAMINOPHEN 500 MG PO TAB [102]: 1000 mg | ORAL | @ 11:00:00 | Stop: 2021-07-29 | NDC 00904673061

## 2021-07-29 MED ADMIN — ACETAMINOPHEN 500 MG PO TAB [102]: 1000 mg | ORAL | @ 05:00:00 | NDC 00904673061

## 2021-07-29 MED ADMIN — CALCIUM CARBONATE 500 MG CALCIUM (1,250 MG) PO TAB [1300]: 1250 mg | ORAL | @ 13:00:00 | Stop: 2021-07-29 | NDC 00904188361

## 2021-07-29 MED ADMIN — OXYCODONE 5 MG PO TAB [10814]: 5 mg | ORAL | @ 11:00:00 | Stop: 2021-07-29 | NDC 42858000110

## 2021-07-29 MED ADMIN — METHOCARBAMOL 750 MG PO TAB [4972]: 750 mg | ORAL | @ 11:00:00 | Stop: 2021-07-29 | NDC 70010077005

## 2021-07-29 MED ADMIN — CHLORTHALIDONE 25 MG PO TAB [1661]: 25 mg | ORAL | @ 13:00:00 | Stop: 2021-07-29 | NDC 00904690061

## 2021-07-29 MED ADMIN — HEPARIN, PORCINE (PF) 5,000 UNIT/0.5 ML IJ SYRG [95535]: 5000 [IU] | SUBCUTANEOUS | @ 05:00:00 | NDC 00409131611

## 2021-07-29 MED ADMIN — LOSARTAN 50 MG PO TAB [76938]: 100 mg | ORAL | @ 02:00:00 | NDC 68084034711

## 2021-07-29 MED FILL — OXYCODONE 5 MG PO TAB: 5 mg | ORAL | 7 days supply | Qty: 25 | Fill #1 | Status: CP

## 2021-07-29 MED FILL — ASPIRIN 81 MG PO CHEW: 81 mg | ORAL | 90 days supply | Qty: 90 | Fill #1 | Status: CP

## 2021-07-29 MED FILL — DIAZEPAM 5 MG PO TAB: 5 mg | ORAL | 5 days supply | Qty: 20 | Fill #1 | Status: CP

## 2021-08-01 ENCOUNTER — Encounter: Admit: 2021-08-01 | Discharge: 2021-08-01 | Payer: BC Managed Care – PPO

## 2021-08-01 NOTE — Telephone Encounter
Patient called as he is having a gout flare up. I asked as to what medications he typically takes. patinet stated in the past he has taken 10 mg of prednisone twice daily. I recommended steering away from steroids if at all possible since surgery was a week ago. Patient asked about a different medication, which I stated would be fine. Patient will follow up with his PCP.

## 2021-08-15 ENCOUNTER — Encounter: Admit: 2021-08-15 | Discharge: 2021-08-15 | Payer: BC Managed Care – PPO

## 2021-08-22 ENCOUNTER — Encounter: Admit: 2021-08-22 | Discharge: 2021-08-22 | Payer: BC Managed Care – PPO

## 2021-08-22 MED ORDER — OXYCODONE 5 MG PO TAB
5-15 mg | ORAL_TABLET | ORAL | 0 refills | 6.00000 days | Status: AC | PRN
Start: 2021-08-22 — End: ?

## 2021-08-30 ENCOUNTER — Encounter: Admit: 2021-08-30 | Discharge: 2021-08-30 | Payer: BC Managed Care – PPO

## 2021-08-30 DIAGNOSIS — Z981 Arthrodesis status: Secondary | ICD-10-CM

## 2021-08-30 NOTE — Patient Instructions
It was nice to see you today.  Thank you for choosing to visit our clinic.  Your time is important, and if you had to wait today, we do apologize.  Our goal is to run exactly on time, however, on occasion, we get behind in clinic due to unexpected patient issues.  We appreciate your patience.    General Instructions:  Scheduling:  Our scheduling phone number is [phone].  How to reach our office:  Please send a MyChart message to the [facility] or leave a voicemail for the nurse, Keri Veale, RN, BSN at [phone]. Please note messages after 3 PM may not be answered until the next business day.  How to get a medication refill:  Please use the MyChart Refill request or contact your pharmacy directly to request medication refills.  Please allow 72 business hours for your request to be completed.    Support: for many chronic illnesses information is available through [facility] at [email or web address] or [phone].    For help with MyChart:  Please call [phone].    For questions on nights, weekends or holidays:  call the Operator at [phone], and ask for the doctor on call for Neurosurgery.    For more information on spinal conditions:  please visit [email or web address]   Our office fax number is: [phone]    Again, thank you for coming in today and allowing us to take part in your care.      Marley Charlot, RN, BSN  Clinical Nurse Coordinator for Dr. Ifije Ohiorhenuan  Marc A. Asher, MD, [facility]  [facility]  Phone [phone]  Fax [phone]

## 2021-09-11 ENCOUNTER — Encounter: Admit: 2021-09-11 | Discharge: 2021-09-11 | Payer: BC Managed Care – PPO

## 2021-09-11 ENCOUNTER — Ambulatory Visit: Admit: 2021-09-11 | Discharge: 2021-09-11 | Payer: BC Managed Care – PPO

## 2021-09-11 DIAGNOSIS — T148XXA Other injury of unspecified body region, initial encounter: Secondary | ICD-10-CM

## 2021-09-11 DIAGNOSIS — I10 Essential (primary) hypertension: Secondary | ICD-10-CM

## 2021-09-11 DIAGNOSIS — E781 Pure hyperglyceridemia: Secondary | ICD-10-CM

## 2021-09-11 DIAGNOSIS — I2510 Atherosclerotic heart disease of native coronary artery without angina pectoris: Secondary | ICD-10-CM

## 2021-09-11 DIAGNOSIS — M5030 Other cervical disc degeneration, unspecified cervical region: Secondary | ICD-10-CM

## 2021-09-11 DIAGNOSIS — G4733 Obstructive sleep apnea (adult) (pediatric): Secondary | ICD-10-CM

## 2021-09-11 DIAGNOSIS — K219 Gastro-esophageal reflux disease without esophagitis: Secondary | ICD-10-CM

## 2021-09-11 DIAGNOSIS — M2550 Pain in unspecified joint: Secondary | ICD-10-CM

## 2021-09-11 DIAGNOSIS — I219 Acute myocardial infarction, unspecified: Secondary | ICD-10-CM

## 2021-09-11 DIAGNOSIS — Z981 Arthrodesis status: Secondary | ICD-10-CM

## 2021-09-11 DIAGNOSIS — L719 Rosacea, unspecified: Secondary | ICD-10-CM

## 2021-09-11 DIAGNOSIS — M5136 Other intervertebral disc degeneration, lumbar region: Secondary | ICD-10-CM

## 2021-09-11 DIAGNOSIS — G959 Disease of spinal cord, unspecified: Secondary | ICD-10-CM

## 2021-09-11 DIAGNOSIS — M4800 Spinal stenosis, site unspecified: Secondary | ICD-10-CM

## 2021-09-11 DIAGNOSIS — M109 Gout, unspecified: Secondary | ICD-10-CM

## 2021-09-11 NOTE — Progress Notes
61 year old male with a history of cervical myelopathy and spinal cord injury here for follow-up at approximately 6 weeks after his posterior cervical fusion  Overall doing relatively well  Significant improvement in neck pain  He does have some posterior neck muscle spasms which are quite sharp and painful  In addition, he reports that he has some slowness opening his right hand.  He does note that the spasms in his right leg and got better after surgery  Imaging reviewed stable hardware position  We will start outpatient physical therapy  He will return to see me in clinic in 6 weeks via telehealth

## 2021-09-12 ENCOUNTER — Encounter: Admit: 2021-09-12 | Discharge: 2021-09-12 | Payer: BC Managed Care – PPO

## 2021-09-25 ENCOUNTER — Encounter: Admit: 2021-09-25 | Discharge: 2021-09-25 | Payer: BC Managed Care – PPO

## 2021-09-25 ENCOUNTER — Ambulatory Visit: Admit: 2021-09-25 | Discharge: 2021-09-26 | Payer: BC Managed Care – PPO

## 2021-09-25 DIAGNOSIS — M4800 Spinal stenosis, site unspecified: Secondary | ICD-10-CM

## 2021-09-25 DIAGNOSIS — K219 Gastro-esophageal reflux disease without esophagitis: Secondary | ICD-10-CM

## 2021-09-25 DIAGNOSIS — I219 Acute myocardial infarction, unspecified: Secondary | ICD-10-CM

## 2021-09-25 DIAGNOSIS — G4733 Obstructive sleep apnea (adult) (pediatric): Secondary | ICD-10-CM

## 2021-09-25 DIAGNOSIS — R809 Proteinuria, unspecified: Secondary | ICD-10-CM

## 2021-09-25 DIAGNOSIS — T148XXA Other injury of unspecified body region, initial encounter: Secondary | ICD-10-CM

## 2021-09-25 DIAGNOSIS — E781 Pure hyperglyceridemia: Secondary | ICD-10-CM

## 2021-09-25 DIAGNOSIS — M5030 Other cervical disc degeneration, unspecified cervical region: Secondary | ICD-10-CM

## 2021-09-25 DIAGNOSIS — I10 Essential (primary) hypertension: Secondary | ICD-10-CM

## 2021-09-25 DIAGNOSIS — I2510 Atherosclerotic heart disease of native coronary artery without angina pectoris: Secondary | ICD-10-CM

## 2021-09-25 DIAGNOSIS — M109 Gout, unspecified: Secondary | ICD-10-CM

## 2021-09-25 DIAGNOSIS — M2550 Pain in unspecified joint: Secondary | ICD-10-CM

## 2021-09-25 DIAGNOSIS — L719 Rosacea, unspecified: Secondary | ICD-10-CM

## 2021-09-25 DIAGNOSIS — M5136 Other intervertebral disc degeneration, lumbar region: Secondary | ICD-10-CM

## 2021-09-25 MED ORDER — SPIRONOLACTONE 25 MG PO TAB
50 mg | ORAL_TABLET | Freq: Every day | ORAL | 3 refills | 90.00000 days | Status: AC
Start: 2021-09-25 — End: ?

## 2021-09-25 NOTE — Patient Instructions
Please do not hesitate to call with any questions.  My nurse Dahlia Client can be reached [phone].   Start taking 25mg  of spironolactone daily    Increase it to 50mg  of spironolactone after a week  Stop the amlodipine    Please go to the lab in 2 weeks    Return to clinic in 3 months

## 2021-09-26 ENCOUNTER — Encounter: Admit: 2021-09-26 | Discharge: 2021-09-26 | Payer: BC Managed Care – PPO

## 2021-09-26 DIAGNOSIS — R809 Proteinuria, unspecified: Secondary | ICD-10-CM

## 2021-09-26 LAB — PROTEIN/CR RATIO,UR RAN
UR CREATININE, RAN: 66 mg/dL
UR TOTAL PROTEIN,RAN: 7 mg/dL

## 2021-10-02 ENCOUNTER — Encounter: Admit: 2021-10-02 | Discharge: 2021-10-02 | Payer: BC Managed Care – PPO

## 2021-10-03 ENCOUNTER — Encounter: Admit: 2021-10-03 | Discharge: 2021-10-03 | Payer: BC Managed Care – PPO

## 2021-10-11 ENCOUNTER — Encounter: Admit: 2021-10-11 | Discharge: 2021-10-11 | Payer: BC Managed Care – PPO

## 2021-10-17 ENCOUNTER — Encounter: Admit: 2021-10-17 | Discharge: 2021-10-17 | Payer: BC Managed Care – PPO

## 2021-11-01 ENCOUNTER — Encounter: Admit: 2021-11-01 | Discharge: 2021-11-01 | Payer: BC Managed Care – PPO

## 2021-11-01 DIAGNOSIS — Z981 Arthrodesis status: Secondary | ICD-10-CM

## 2021-11-01 NOTE — Telephone Encounter
Spoke with patient in regards to his upcoming appointment and needing an x-ray prior. Patient states he would like the order sent to [facility] in [place]. Patient knows to reach out one x-ray has been completed. The order has been faxed.

## 2021-11-01 NOTE — Patient Instructions
It was nice to see you today.  Thank you for choosing to visit our clinic.  Your time is important, and if you had to wait today, we do apologize.  Our goal is to run exactly on time, however, on occasion, we get behind in clinic due to unexpected patient issues.  We appreciate your patience.    General Instructions:  Scheduling:  Our scheduling phone number is [phone].  How to reach our office:  Please send a MyChart message to the [facility] or leave a voicemail for the nurse, Hilma Steinhilber, RN, BSN at [phone]. Please note messages after 3 PM may not be answered until the next business day.  How to get a medication refill:  Please use the MyChart Refill request or contact your pharmacy directly to request medication refills.  Please allow 72 business hours for your request to be completed.    Support: for many chronic illnesses information is available through [facility] at [email or web address] or 913-574-0900.    For help with MyChart:  Please call [phone].    For questions on nights, weekends or holidays:  call the Operator at [phone], and ask for the doctor on call for Neurosurgery.    For more information on spinal conditions:  please visit [email or web address]   Our office fax number is: [phone]    Again, thank you for coming in today and allowing us to take part in your care.      Holiday Mcmenamin, RN, BSN  Clinical Nurse Coordinator for Dr. Ifije Ohiorhenuan  Marc A. Asher, MD, [facility]  [facility]  Phone [phone]  Fax [phone]

## 2021-11-02 ENCOUNTER — Encounter: Admit: 2021-11-02 | Discharge: 2021-11-02 | Payer: BC Managed Care – PPO

## 2021-11-06 ENCOUNTER — Encounter: Admit: 2021-11-06 | Discharge: 2021-11-06 | Payer: BC Managed Care – PPO

## 2021-11-06 ENCOUNTER — Ambulatory Visit: Admit: 2021-11-06 | Discharge: 2021-11-07 | Payer: BC Managed Care – PPO

## 2021-11-06 DIAGNOSIS — M5136 Other intervertebral disc degeneration, lumbar region: Secondary | ICD-10-CM

## 2021-11-06 DIAGNOSIS — T148XXA Other injury of unspecified body region, initial encounter: Secondary | ICD-10-CM

## 2021-11-06 DIAGNOSIS — E781 Pure hyperglyceridemia: Secondary | ICD-10-CM

## 2021-11-06 DIAGNOSIS — G4733 Obstructive sleep apnea (adult) (pediatric): Secondary | ICD-10-CM

## 2021-11-06 DIAGNOSIS — M2550 Pain in unspecified joint: Secondary | ICD-10-CM

## 2021-11-06 DIAGNOSIS — M4800 Spinal stenosis, site unspecified: Secondary | ICD-10-CM

## 2021-11-06 DIAGNOSIS — G959 Disease of spinal cord, unspecified: Secondary | ICD-10-CM

## 2021-11-06 DIAGNOSIS — I10 Essential (primary) hypertension: Secondary | ICD-10-CM

## 2021-11-06 DIAGNOSIS — M109 Gout, unspecified: Secondary | ICD-10-CM

## 2021-11-06 DIAGNOSIS — I219 Acute myocardial infarction, unspecified: Secondary | ICD-10-CM

## 2021-11-06 DIAGNOSIS — K219 Gastro-esophageal reflux disease without esophagitis: Secondary | ICD-10-CM

## 2021-11-06 DIAGNOSIS — M5030 Other cervical disc degeneration, unspecified cervical region: Secondary | ICD-10-CM

## 2021-11-06 DIAGNOSIS — L719 Rosacea, unspecified: Secondary | ICD-10-CM

## 2021-11-06 DIAGNOSIS — I2510 Atherosclerotic heart disease of native coronary artery without angina pectoris: Secondary | ICD-10-CM

## 2021-11-06 NOTE — Progress Notes
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

## 2021-11-14 ENCOUNTER — Encounter: Admit: 2021-11-14 | Discharge: 2021-11-14 | Payer: BC Managed Care – PPO

## 2021-11-19 ENCOUNTER — Encounter: Admit: 2021-11-19 | Discharge: 2021-11-19 | Payer: BC Managed Care – PPO

## 2021-11-19 NOTE — Telephone Encounter
Spoke with patient and informed him that Dr. [place] would like to see him in person since he has worsening lumbar symptoms. Patient was agreeable to new day/time.

## 2021-11-28 ENCOUNTER — Encounter: Admit: 2021-11-28 | Discharge: 2021-11-28 | Payer: BC Managed Care – PPO

## 2021-11-28 ENCOUNTER — Ambulatory Visit: Admit: 2021-11-28 | Discharge: 2021-11-28 | Payer: BC Managed Care – PPO

## 2021-11-28 DIAGNOSIS — M109 Gout, unspecified: Secondary | ICD-10-CM

## 2021-11-28 DIAGNOSIS — I219 Acute myocardial infarction, unspecified: Secondary | ICD-10-CM

## 2021-11-28 DIAGNOSIS — M48062 Spinal stenosis, lumbar region with neurogenic claudication: Secondary | ICD-10-CM

## 2021-11-28 DIAGNOSIS — E781 Pure hyperglyceridemia: Secondary | ICD-10-CM

## 2021-11-28 DIAGNOSIS — M5030 Other cervical disc degeneration, unspecified cervical region: Secondary | ICD-10-CM

## 2021-11-28 DIAGNOSIS — M2550 Pain in unspecified joint: Secondary | ICD-10-CM

## 2021-11-28 DIAGNOSIS — T148XXA Other injury of unspecified body region, initial encounter: Secondary | ICD-10-CM

## 2021-11-28 DIAGNOSIS — G4733 Obstructive sleep apnea (adult) (pediatric): Secondary | ICD-10-CM

## 2021-11-28 DIAGNOSIS — K219 Gastro-esophageal reflux disease without esophagitis: Secondary | ICD-10-CM

## 2021-11-28 DIAGNOSIS — M4800 Spinal stenosis, site unspecified: Secondary | ICD-10-CM

## 2021-11-28 DIAGNOSIS — M5136 Other intervertebral disc degeneration, lumbar region: Secondary | ICD-10-CM

## 2021-11-28 DIAGNOSIS — I2510 Atherosclerotic heart disease of native coronary artery without angina pectoris: Secondary | ICD-10-CM

## 2021-11-28 DIAGNOSIS — L719 Rosacea, unspecified: Secondary | ICD-10-CM

## 2021-11-28 DIAGNOSIS — I10 Essential (primary) hypertension: Secondary | ICD-10-CM

## 2021-11-28 DIAGNOSIS — M5416 Radiculopathy, lumbar region: Secondary | ICD-10-CM

## 2021-11-28 MED ORDER — CEFAZOLIN INJ 1GM IVP
2 g | Freq: Once | INTRAVENOUS | 0 refills
Start: 2021-11-28 — End: ?

## 2021-11-28 NOTE — Progress Notes
Date of Service: 11/28/2021         Chief complaint    Chief Complaint   Patient presents with   ? Follow Up     MRI follow up        HPI  Brian Peterson is a 61 y.o. male who presents with a history of cervical stenosis status post posterior cervical fusion here for follow-up of his recent lumbar MRI.  He complains of significant low back pain.  He has stable significant difficulty standing or walking for an extended period of time due to this back pain.  He also complains of hip pain.  He reports the pain is sharp and shooting.  States that he does not have any meaningful quality of life at this time because he is unable to stand for longer than 30 seconds.                  Oswestry: Oswestry Total Score:: 78        PMH    Medical History:   Diagnosis Date   ? Acid reflux     tums prn   ? CAD (coronary artery disease)    ? Degenerative disc disease, cervical    ? Degenerative disc disease, lumbar    ? Gout    ? Heart attack (HCC) 2017   ? High triglycerides    ? Hypertension    ? Joint pain    ? Nerve injury 05/20/1998    cervical fusions   ? OSA on CPAP    ? Rosacea    ? Spinal stenosis            ROS  Review of Systems   Review of Systems   All other systems reviewed and are negative.      FH    Family History   Problem Relation Age of Onset   ? Hypertension Mother    ? Back pain Mother    ? Hypertension Brother    ? Heart Surgery Brother    ? Back pain Brother    ? Back pain Maternal Grandfather    ? Heart problem Brother    ? Back pain Brother    ? Heart problem Brother    ? Stroke Brother      Social History     Socioeconomic History   ? Marital status: Married   Occupational History   ? Occupation: Architectural technologist   Tobacco Use   ? Smoking status: Never   ? Smokeless tobacco: Never   Vaping Use   ? Vaping Use: Never used   Substance and Sexual Activity   ? Alcohol use: Not Currently   ? Drug use: Never   ? Sexual activity: Yes     Partners: Female     Birth control/protection: None SH    Surgical History:   Procedure Laterality Date   ? HX CERVICAL FUSION  2000   ? LAMINECTOMY  2011    L3-4, L4-5   ? HX LUMBAR DISKECTOMY  2012   ? HX CHOLECYSTECTOMY  2015   ? HX CORONARY ARTERY BYPASS GRAFT  2017   ? Cervical 3 to Cervical 7 Posterior Cervical Fusion Cervical 3 to Cervical 7 Laminectomy N/A 07/26/2021    Performed by Jobe Marker, MD at [facility]   ? 22600--FUSION SPINE POSTERIOR - CERVICAL BELOW C2 N/A 07/26/2021    Performed by Jobe Marker, MD at [facility]   ? 63048--LAMINECTOMY/  FACETECTOMY/ FORAMINOTOMY WITH DECOMPRESSION - 1 VERTEBRAL SEGMENT - EACH ADDITIONAL CERVICAL/ THORACIC/ LUMBAR SEGMENT N/A 07/26/2021    Performed by Jobe Marker, MD at [facility]   ? 22840--POSTERIOR NON-SEGMENTAL INSTRUMENTATION SPINE N/A 07/26/2021    Performed by Jobe Marker, MD at [facility]   ? 20936--AUTOGRAFT - SPINE SURGERY ONLY N/A 07/26/2021    Performed by Jobe Marker, MD at [facility]   ? [phone] (additional levels)--POSTERIOR SEGMENTAL INSTRUMENTATION - 3 TO 6 VERTEBRAL SEGMENTS N/A 07/26/2021    Performed by Jobe Marker, MD at [facility]       Meds    ? acetaminophen (TYLENOL EXTRA STRENGTH) 500 mg tablet Take two tablets by mouth every 6 hours as needed. Max of 4,000 mg of acetaminophen in 24 hours.   ? allopurinoL (ZYLOPRIM) 300 mg tablet Take one tablet by mouth daily. Take with food.   ? amLODIPine (NORVASC) 5 mg tablet Take one tablet by mouth daily.   ? aspirin 81 mg chewable tablet Chew one tablet by mouth daily. Take with food. Can resume day 5 after surgery   ? aspirin EC 81 mg tablet Take one tablet by mouth daily. Take with food.   ? baclofen 10 mg oral granules in packet    ? calcium carbonate (OS-CAL) 1250 mg tablet Take one tablet by mouth daily.   ? carvediloL (COREG) 12.5 mg tablet Take one tablet by mouth twice daily.   ? chlorthalidone (HYGROTON) 25 mg tablet Take one tablet by mouth every morning.   ? coenzyme Q10 50 mg capsule Take one capsule by mouth daily.   ? diazePAM (VALIUM) 5 mg tablet Take one tablet by mouth every 6 hours as needed.   ? docusate (COLACE) 100 mg capsule Take one capsule by mouth twice daily.   ? losartan (COZAAR) 50 mg tablet Take two tablets by mouth twice daily.   ? multivitamin (ONE-A-DAY) tablet Take one tablet by mouth daily.   ? oxyCODONE (ROXICODONE) 5 mg tablet Take one tablet to three tablets by mouth every 3 hours as needed.   ? oxyCODONE-acetaminophen (PERCOCET) 5-325 mg tablet Take one tablet by mouth three times daily as needed.   ? potassium chloride (K-TAB) 20 mEq tablet Take one tablet by mouth twice daily.   ? spironolactone (ALDACTONE) 25 mg tablet Take two tablets by mouth daily for 360 days. Take with food.   ? tamsulosin (FLOMAX) 0.4 mg capsule Take one capsule by mouth at bedtime daily.   ? tiZANidine (ZANAFLEX) 4 mg tablet one-half tablet.       Allergies    No Known Allergies      Exam  Physical Exam   Constitutional: he is oriented to person, place, and time. he appears well-developed and well-nourished.    Head: Normocephalic and atraumatic.    Eyes: Conjunctivae and EOM are normal.    Pulmonary/Chest: Effort normal. No respiratory distress.   Neurological: he is alert and oriented to person, place, and time. No cranial nerve deficit or sensory deficit. he exhibits normal muscle tone. Coordination normal.   Skin: Skin is warm and dry.   Psychiatric: he has a normal mood and affect. his behavior is normal. Judgment and thought content normal.    Vitals reviewed.  A&Ox3  FS, TML, EOMI      D B Tr Hg IO  R 5/5 5/5 5/5 5/5 5/5  L 5/5 5/5 5/5 5/5 5/5        HF KE  DF PF EHL  R  5/5 5/5 5/5 5/5 5/5  L  5/5 5/5 5/5 5/5 5/5      Vitals:   Vitals:    11/28/21 1051   BP: 124/76   BP Source: Arm, Right Upper   Pulse: 67   SpO2: 96%   PainSc: Five   Weight: 99.8 kg (220 lb)     Body mass index is 32.49 kg/m?.      Imaging:   I independently reviewed the patient's imaging findings:  MRI of lumbar spine reviewed notable for severe stenosis at L2-3 and moderate to severe stenosis at L3-4.  At L3-4 and L4-5, there is evidence of her previous microdiscectomy     Assessment/Plan    61 year old male with history of cervical stenosis who presents for evaluation of his low back pain found to have severe stenosis at L2-3 and moderate to severe stenosis at L3-4  Imaging findings reviewed with patient  He does not have any meaningful quality of life at this time due to low back pain difficulty walking  Recommend surgical intervention: L2-L4 lumbar laminectomy open  We will plan for surgery at a mutually convenient time  Patient has had an adequate trial of > 12 month of rest, exercise, multimodal treatment, and the passage of time without improvement of symptoms. The pain has significant impact on the daily quality of life.     The risks and benefits of surgery were explained in detail to the patient which included, but certainly were not limited to: bleeding, infection, nerve or vessel damage, scar, pain, risks of anesthesia, death, need for further surgery, iatrogenic instability, heart attack, stroke, massive bleeding, coma death. The patient understands the risks of the procedure and elects to proceed.                           Encounter Medications   Medications   ? oxyCODONE-acetaminophen (PERCOCET) 5-325 mg tablet     Sig: Take one tablet by mouth three times daily as needed.   ? tiZANidine (ZANAFLEX) 4 mg tablet     Sig: one-half tablet.                              Please note that documentation of records were done during a busy neurosurgical clinic. Attempts have been made to review the document for any errors. Please excuse for brevity and typographical errors.

## 2021-11-29 ENCOUNTER — Encounter: Admit: 2021-11-29 | Discharge: 2021-11-29 | Payer: BC Managed Care – PPO

## 2021-11-29 DIAGNOSIS — E781 Pure hyperglyceridemia: Secondary | ICD-10-CM

## 2021-11-29 DIAGNOSIS — M5030 Other cervical disc degeneration, unspecified cervical region: Secondary | ICD-10-CM

## 2021-11-29 DIAGNOSIS — M2550 Pain in unspecified joint: Secondary | ICD-10-CM

## 2021-11-29 DIAGNOSIS — M4800 Spinal stenosis, site unspecified: Secondary | ICD-10-CM

## 2021-11-29 DIAGNOSIS — G4733 Obstructive sleep apnea (adult) (pediatric): Secondary | ICD-10-CM

## 2021-11-29 DIAGNOSIS — T148XXA Other injury of unspecified body region, initial encounter: Secondary | ICD-10-CM

## 2021-11-29 DIAGNOSIS — I2510 Atherosclerotic heart disease of native coronary artery without angina pectoris: Secondary | ICD-10-CM

## 2021-11-29 DIAGNOSIS — I219 Acute myocardial infarction, unspecified: Secondary | ICD-10-CM

## 2021-11-29 DIAGNOSIS — I10 Essential (primary) hypertension: Secondary | ICD-10-CM

## 2021-11-29 DIAGNOSIS — L719 Rosacea, unspecified: Secondary | ICD-10-CM

## 2021-11-29 DIAGNOSIS — M5136 Other intervertebral disc degeneration, lumbar region: Secondary | ICD-10-CM

## 2021-11-29 DIAGNOSIS — K219 Gastro-esophageal reflux disease without esophagitis: Secondary | ICD-10-CM

## 2021-11-29 DIAGNOSIS — M109 Gout, unspecified: Secondary | ICD-10-CM

## 2021-11-30 ENCOUNTER — Encounter: Admit: 2021-11-30 | Discharge: 2021-11-30 | Payer: BC Managed Care – PPO

## 2021-12-11 ENCOUNTER — Encounter: Admit: 2021-12-11 | Discharge: 2021-12-11 | Payer: BC Managed Care – PPO

## 2021-12-17 ENCOUNTER — Encounter: Admit: 2021-12-17 | Discharge: 2021-12-17 | Payer: BC Managed Care – PPO

## 2021-12-17 DIAGNOSIS — M5030 Other cervical disc degeneration, unspecified cervical region: Secondary | ICD-10-CM

## 2021-12-17 DIAGNOSIS — T148XXA Other injury of unspecified body region, initial encounter: Secondary | ICD-10-CM

## 2021-12-17 DIAGNOSIS — M4800 Spinal stenosis, site unspecified: Secondary | ICD-10-CM

## 2021-12-17 DIAGNOSIS — I10 Essential (primary) hypertension: Secondary | ICD-10-CM

## 2021-12-17 DIAGNOSIS — M2550 Pain in unspecified joint: Secondary | ICD-10-CM

## 2021-12-17 DIAGNOSIS — L719 Rosacea, unspecified: Secondary | ICD-10-CM

## 2021-12-17 DIAGNOSIS — M109 Gout, unspecified: Secondary | ICD-10-CM

## 2021-12-17 DIAGNOSIS — K219 Gastro-esophageal reflux disease without esophagitis: Secondary | ICD-10-CM

## 2021-12-17 DIAGNOSIS — G4733 Obstructive sleep apnea (adult) (pediatric): Secondary | ICD-10-CM

## 2021-12-17 DIAGNOSIS — M5136 Other intervertebral disc degeneration, lumbar region: Secondary | ICD-10-CM

## 2021-12-17 DIAGNOSIS — E781 Pure hyperglyceridemia: Secondary | ICD-10-CM

## 2021-12-17 DIAGNOSIS — I219 Acute myocardial infarction, unspecified: Secondary | ICD-10-CM

## 2021-12-17 DIAGNOSIS — I2510 Atherosclerotic heart disease of native coronary artery without angina pectoris: Secondary | ICD-10-CM

## 2021-12-17 MED ORDER — LIDOCAINE (PF) 200 MG/10 ML (2 %) IJ SYRG
INTRAVENOUS | 0 refills | Status: DC
Start: 2021-12-17 — End: 2021-12-17
  Administered 2021-12-17: 13:00:00 80 mg via INTRAVENOUS

## 2021-12-17 MED ORDER — ROCURONIUM 10 MG/ML IV SOLN
INTRAVENOUS | 0 refills | Status: DC
Start: 2021-12-17 — End: 2021-12-17
  Administered 2021-12-17: 14:00:00 10 mg via INTRAVENOUS
  Administered 2021-12-17: 13:00:00 50 mg via INTRAVENOUS

## 2021-12-17 MED ORDER — GLYCOPYRROLATE 0.2 MG/ML IJ SOLN
INTRAVENOUS | 0 refills | Status: DC
Start: 2021-12-17 — End: 2021-12-17
  Administered 2021-12-17: 13:00:00 .2 mg via INTRAVENOUS

## 2021-12-17 MED ORDER — ONDANSETRON HCL (PF) 4 MG/2 ML IJ SOLN
INTRAVENOUS | 0 refills | Status: DC
Start: 2021-12-17 — End: 2021-12-17
  Administered 2021-12-17: 14:00:00 4 mg via INTRAVENOUS

## 2021-12-17 MED ORDER — PROPOFOL 10 MG/ML IV EMUL 100 ML (INFUSION)(AM)(OR)
INTRAVENOUS | 0 refills | Status: DC
Start: 2021-12-17 — End: 2021-12-17
  Administered 2021-12-17: 13:00:00 150 ug/kg/min via INTRAVENOUS

## 2021-12-17 MED ORDER — FENTANYL CITRATE (PF) 50 MCG/ML IJ SOLN
INTRAVENOUS | 0 refills | Status: DC
Start: 2021-12-17 — End: 2021-12-17
  Administered 2021-12-17 (×4): 25 ug via INTRAVENOUS

## 2021-12-17 MED ORDER — REMIFENTANYL 1000MCG IN NS 20ML (OR)
INTRAVENOUS | 0 refills | Status: DC
Start: 2021-12-17 — End: 2021-12-17
  Administered 2021-12-17 (×2): 50 ug via INTRAVENOUS

## 2021-12-17 MED ORDER — DEXAMETHASONE SODIUM PHOSPHATE 4 MG/ML IJ SOLN
INTRAVENOUS | 0 refills | Status: DC
Start: 2021-12-17 — End: 2021-12-17
  Administered 2021-12-17: 13:00:00 4 mg via INTRAVENOUS

## 2021-12-17 MED ORDER — SUGAMMADEX 100 MG/ML IV SOLN
INTRAVENOUS | 0 refills | Status: DC
Start: 2021-12-17 — End: 2021-12-17
  Administered 2021-12-17: 15:00:00 200 mg via INTRAVENOUS

## 2021-12-17 MED ORDER — PHENYLEPHRINE HCL IN 0.9% NACL 1 MG/10 ML (100 MCG/ML) IV SYRG
INTRAVENOUS | 0 refills | Status: DC
Start: 2021-12-17 — End: 2021-12-17
  Administered 2021-12-17 (×2): 200 ug via INTRAVENOUS
  Administered 2021-12-17: 15:00:00 100 ug via INTRAVENOUS
  Administered 2021-12-17: 13:00:00 200 ug via INTRAVENOUS
  Administered 2021-12-17 (×2): 100 ug via INTRAVENOUS

## 2021-12-17 MED ORDER — PHENYLEPHRINE 40 MCG/ML IN NS IV DRIP (STD CONC)
INTRAVENOUS | 0 refills | Status: DC
Start: 2021-12-17 — End: 2021-12-17
  Administered 2021-12-17 (×2): .5 ug/kg/min via INTRAVENOUS

## 2021-12-17 MED ORDER — ARTIFICIAL TEARS (PF) SINGLE DOSE DROPS GROUP
OPHTHALMIC | 0 refills | Status: DC
Start: 2021-12-17 — End: 2021-12-17
  Administered 2021-12-17: 13:00:00 2 [drp] via OPHTHALMIC

## 2021-12-17 MED ORDER — PROPOFOL INJ 10 MG/ML IV VIAL
INTRAVENOUS | 0 refills | Status: DC
Start: 2021-12-17 — End: 2021-12-17
  Administered 2021-12-17: 13:00:00 140 mg via INTRAVENOUS
  Administered 2021-12-17: 15:00:00 20 mg via INTRAVENOUS

## 2021-12-17 MED ADMIN — MAGNESIUM HYDROXIDE 400 MG/5 ML PO SUSP [79944]: 30 mL | ORAL | @ 17:00:00 | NDC 66689005301

## 2021-12-17 MED ADMIN — SODIUM CHLORIDE 0.9 % IR SOLN [11403]: 1000 mL | @ 13:00:00 | Stop: 2021-12-17 | NDC 00338004804

## 2021-12-17 MED ADMIN — DOCUSATE SODIUM 100 MG PO CAP [2566]: 100 mg | ORAL | @ 17:00:00 | NDC 00904718361

## 2021-12-17 MED ADMIN — OXYCODONE 5 MG PO TAB [10814]: 10 mg | ORAL | @ 15:00:00 | Stop: 2021-12-17 | NDC 00406055223

## 2021-12-17 MED ADMIN — SPIRONOLACTONE 50 MG PO TAB [11426]: 50 mg | ORAL | @ 17:00:00 | NDC 60687047611

## 2021-12-17 MED ADMIN — CEFAZOLIN INJ 1GM IVP [210319]: 2 g | INTRAVENOUS | @ 13:00:00 | Stop: 2021-12-17 | NDC 00143992490

## 2021-12-17 MED ADMIN — ACETAMINOPHEN 325 MG PO TAB [101]: 650 mg | ORAL | NDC 00904677361

## 2021-12-17 MED ADMIN — METHOCARBAMOL 750 MG PO TAB [4972]: 750 mg | ORAL | @ 20:00:00 | NDC 70010077005

## 2021-12-17 MED ADMIN — SODIUM CHLORIDE 0.9 % IV SOLP [27838]: 1000 mL | INTRAVENOUS | @ 12:00:00 | Stop: 2021-12-17 | NDC 00338004904

## 2021-12-17 MED ADMIN — VANCOMYCIN 1,000 MG IV SOLR [8442]: 1 g | TOPICAL | @ 14:00:00 | Stop: 2021-12-17 | NDC 00409653311

## 2021-12-17 MED ADMIN — CEFAZOLIN INJ 1GM IVP [210319]: 2 g | INTRAVENOUS | @ 20:00:00 | Stop: 2021-12-18 | NDC 60505614200

## 2021-12-17 MED ADMIN — ACETAMINOPHEN 325 MG PO TAB [101]: 650 mg | ORAL | @ 17:00:00 | NDC 00904677361

## 2021-12-17 MED ADMIN — LOSARTAN 50 MG PO TAB [76938]: 50 mg | ORAL | @ 17:00:00 | NDC 68084034711

## 2021-12-17 MED ADMIN — THROMBIN (BOVINE) 5,000 UNIT TP SOLR [164515]: 5000 [IU] | TOPICAL | @ 13:00:00 | Stop: 2021-12-17 | NDC 60793031501

## 2021-12-17 MED ADMIN — TIZANIDINE 4 MG PO TAB [14793]: 2 mg | ORAL | @ 17:00:00 | NDC 00904641861

## 2021-12-17 MED ADMIN — BUPIVACAINE-EPINEPHRINE 0.5 %-1:200,000 IJ SOLN [14984]: 10 mL | INTRAMUSCULAR | @ 13:00:00 | Stop: 2021-12-17 | NDC 63323046301

## 2021-12-17 MED ADMIN — CEFAZOLIN 1 GRAM IJ SOLR [1445]: 1000 mL | @ 13:00:00 | Stop: 2021-12-17 | NDC 00143992490

## 2021-12-17 MED ADMIN — OXYCODONE 5 MG PO TAB [10814]: 5 mg | ORAL | @ 20:00:00 | NDC 00406055223

## 2021-12-17 NOTE — Anesthesia Pre-Procedure Evaluation
[facility] Anesthesia Pre-Procedure Evaluation    Name: Brian Peterson      MRN: [id number]     DOB: 22-Apr-1961     Age: 61 y.o.     Sex: male   _________________________________________________________________________     Procedure Info:   Procedure Information     Date/Time: 12/17/21 0730    Procedures:       Lumbar 2 to Lumbar 3, Lumbar 3 to Lumbar 4 Laminectomy (Open) - C-Arm, 2.5 hrs      63048-LAMINECTOMY/ FACETECTOMY/ FORAMINOTOMY WITH DECOMPRESSION - 1 VERTEBRAL SEGMENT - EACH ADDITIONAL CERVICAL/ THORACIC/ LUMBAR SEGMENT    [facility]: [facility] / [facility]iop    Surgeons: Jobe Marker, MD          Physical Assessment  Vital Signs (last filed in past 24 hours):  BP: 126/77 (07/31 0616)  Temp: 36.4 ?C (97.6 ?F) (07/31 4540)  Pulse: 69 (07/31 0616)  Respirations: 17 PER MINUTE (07/31 0616)  SpO2: 96 % (07/31 0616)  O2 Device: None (Room air) (07/31 0616)  Height: 175.3 cm (5' 9) (07/31 9811)  Weight: 95.9 kg (211 lb 6.4 oz) (07/31 0616)  SpO2 Pulse: 69 (07/31 0616)      Patient History   No Known Allergies     Current Medications    Medication Directions   allopurinoL (ZYLOPRIM) 300 mg tablet Take one tablet by mouth daily. Take with food.   aspirin EC 81 mg tablet Take one tablet by mouth daily. Take with food.   CALCIUM PO Take 500 mg by mouth daily.   coenzyme Q10 50 mg capsule Take one capsule by mouth daily.   fenofibrate (LOFIBRA) 160 mg tablet Take one tablet by mouth daily. Take with food.   losartan (COZAAR) 50 mg tablet Take one tablet by mouth twice daily.   multivitamin (ONE-A-DAY) tablet Take one tablet by mouth daily.   oxyCODONE-acetaminophen (PERCOCET) 5-325 mg tablet Take one tablet by mouth three times daily as needed.   potassium chloride (K-TAB) 20 mEq tablet Take one tablet by mouth daily.   spironolactone (ALDACTONE) 25 mg tablet Take two tablets by mouth daily for 360 days. Take with food.   tamsulosin (FLOMAX) 0.4 mg capsule Take one capsule by mouth at bedtime daily.   tiZANidine (ZANAFLEX) 4 mg tablet Take one-half tablet by mouth three times daily.       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      Neuro/Psych       No seizures      Neuromuscular disease (lumbar radiculopathy)      No CVA      Musculoskeletal         Neck pain (losing ability to use legs, balance issues, drop foot, weak ankle)      Back pain (spinal stenosis)      Arthritis (DDD):         Endocrine/Other       No diabetes        No hypothyroidism      No hyperthyroidism      No anemia       Physical Exam    Airway Findings      Mallampati: III      TM distance: >3 FB      Neck ROM: limited      Mouth opening: good      Comments: 07/26/21; 0755; Mask ventilation not attempted (0); Video laryngoscopy, Stylet, Rapid sequence; Single-Lumen, Cuffed; ETT Size: 7.9mm; GlideScope; Blade Size: 3; Cricoid Pressure: No; Oral; 1-Full view of the glottis; 1 insertion attempt; Auscultation, ETCO2 Detector; Vol of Air in Cuff: 8 mL; Taped at Gums: 22 centimeters; atraumatic; dentition and mucosa intact. neck remained neutral.; 07/26/21; 1113    Cardiovascular Findings:       Rhythm: regular      Rate: normal    Pulmonary Findings:       Breath sounds clear to auscultation.    Abdominal Findings:       Obese    Neurological Findings:       Alert and oriented x 3    Constitutional findings:       No acute distress       Diagnostic Tests  Hematology:   Lab Results   Component Value Date    HGB 14.7 12/17/2021    HCT 41.6 12/17/2021    PLTCT 153 12/17/2021    WBC 5.1 12/17/2021    MCV 87.0 12/17/2021    MCH 30.8 12/17/2021    MCHC 35.4 12/17/2021    MPV 6.9 12/17/2021    RDW 14.3 12/17/2021         General Chemistry:   Lab Results   Component Value Date    NA 136 12/17/2021    K 3.9 12/17/2021    CL 103 12/17/2021    CO2 25 12/17/2021    GAP 8 12/17/2021    BUN 18 12/17/2021    CR 1.32 12/17/2021    GLU 124 12/17/2021    CA 9.4 12/17/2021    ALBUMIN 4.6 12/17/2021    TOTBILI 0.6 12/17/2021      Coagulation:   Lab Results   Component Value Date    PT 11.7  12/17/2021    PTT 30.9 12/17/2021    INR 1.1 12/17/2021       PAC Plan    Alerts    Anesthesia Plan    ASA score: 3   Plan: general  Induction method: intravenous  NPO status: acceptable      Informed Consent  Anesthetic plan and risks discussed with patient.  Use of blood products discussed with patient  Blood Consent: consented      Plan discussed with: anesthesiologist and CRNA.

## 2021-12-17 NOTE — Anesthesia Post-Procedure Evaluation
Post-Anesthesia Evaluation    Name: Brian Peterson      MRN: [id number]     DOB: Oct 14, 1960     Age: 61 y.o.     Sex: male   __________________________________________________________________________     Procedure Information     Anesthesia Start Date/Time: 12/17/21 3254    Procedures:       Lumbar 2 to Lumbar 3, Lumbar 3 to Lumbar 4 Laminectomy (Open) (Spine Lumbar) - C-Arm, 2.5 hrs      63048-LAMINECTOMY/ FACETECTOMY/ FORAMINOTOMY WITH DECOMPRESSION - 1 VERTEBRAL SEGMENT - EACH ADDITIONAL CERVICAL/ THORACIC/ LUMBAR SEGMENT (Spine Lumbar)    Location: [facility]    Surgeons: Jobe Marker, MD          Post-Anesthesia Vitals  BP: 110/66 (07/31 1100)  Pulse: 63 (07/31 1100)  Respirations: 14 PER MINUTE (07/31 1100)  SpO2: 97 % (07/31 1100)  O2 Device: None (Room air) (07/31 1045)   Vitals Value Taken Time   BP 110/66 12/17/21 1100   Temp 36.5 C (97.7 F) 12/17/21 0958   Pulse 63 12/17/21 1100   Respirations 14 PER MINUTE 12/17/21 1100   SpO2 97 % 12/17/21 1100   O2 Device None (Room air) 12/17/21 1045   ABP     ART BP           Post Anesthesia Evaluation Note    Evaluation location: Pre/Post  Patient participation: recovered; patient participated in evaluation  Level of consciousness: alert    Pain score: 3 (tolerable per patient)  Pain management: adequate    Hydration: normovolemia  Temperature: 36.0C - 38.4C  Airway patency: adequate    Perioperative Events       Post-op nausea and vomiting: no PONV    Postoperative Status  Cardiovascular status: hemodynamically stable  Respiratory status: spontaneous ventilation (room air)        Perioperative Events  There were no known notable events for this encounter.

## 2021-12-18 ENCOUNTER — Encounter: Admit: 2021-12-18 | Discharge: 2021-12-18 | Payer: BC Managed Care – PPO

## 2021-12-18 MED ADMIN — METHOCARBAMOL 500 MG PO TAB [4971]: 1000 mg | ORAL | @ 19:00:00 | Stop: 2021-12-18 | NDC 00904705761

## 2021-12-18 MED ADMIN — OXYCODONE 5 MG PO TAB [10814]: 5 mg | ORAL | @ 01:00:00 | NDC 00406055223

## 2021-12-18 MED ADMIN — SENNOSIDES-DOCUSATE SODIUM 8.6-50 MG PO TAB [40926]: 1 | ORAL | @ 01:00:00 | NDC 00536124801

## 2021-12-18 MED ADMIN — OXYCODONE 5 MG PO TAB [10814]: 5 mg | ORAL | @ 14:00:00 | Stop: 2021-12-18 | NDC 00406055223

## 2021-12-18 MED ADMIN — DOCUSATE SODIUM 100 MG PO CAP [2566]: 100 mg | ORAL | @ 14:00:00 | Stop: 2021-12-18 | NDC 00904718361

## 2021-12-18 MED ADMIN — OXYCODONE 5 MG PO TAB [10814]: 5 mg | ORAL | @ 19:00:00 | Stop: 2021-12-18 | NDC 00406055223

## 2021-12-18 MED ADMIN — ALLOPURINOL 100 MG PO TAB [310]: 300 mg | ORAL | @ 16:00:00 | Stop: 2021-12-18 | NDC 00904704161

## 2021-12-18 MED ADMIN — SENNOSIDES-DOCUSATE SODIUM 8.6-50 MG PO TAB [40926]: 1 | ORAL | @ 14:00:00 | Stop: 2021-12-18 | NDC 00536124801

## 2021-12-18 MED ADMIN — LIDOCAINE 5 % TP PTMD [80759]: 2 | TOPICAL | @ 14:00:00 | Stop: 2021-12-18 | NDC 00591352511

## 2021-12-18 MED ADMIN — SPIRONOLACTONE 50 MG PO TAB [11426]: 50 mg | ORAL | @ 14:00:00 | Stop: 2021-12-18 | NDC 60687047611

## 2021-12-18 MED ADMIN — METHOCARBAMOL 750 MG PO TAB [4972]: 750 mg | ORAL | @ 01:00:00 | NDC 70010077005

## 2021-12-18 MED ADMIN — OXYCODONE 5 MG PO TAB [10814]: 5 mg | ORAL | @ 04:00:00 | NDC 00406055223

## 2021-12-18 MED ADMIN — ACETAMINOPHEN 500 MG PO TAB [102]: 1000 mg | ORAL | @ 19:00:00 | Stop: 2021-12-18 | NDC 00904673061

## 2021-12-18 MED ADMIN — WATER FOR INJECTION, STERILE IJ SOLN [79513]: 20 mL | INTRAVENOUS | @ 04:00:00 | Stop: 2021-12-18 | NDC 00409488717

## 2021-12-18 MED ADMIN — ACETAMINOPHEN 500 MG PO TAB [102]: 1000 mg | ORAL | @ 14:00:00 | Stop: 2021-12-18 | NDC 00904673061

## 2021-12-18 MED ADMIN — TAMSULOSIN 0.4 MG PO CAP [80077]: 0.4 mg | ORAL | @ 01:00:00 | NDC 00904640161

## 2021-12-18 MED ADMIN — DOCUSATE SODIUM 100 MG PO CAP [2566]: 100 mg | ORAL | @ 01:00:00 | NDC 00904718361

## 2021-12-18 MED ADMIN — LOSARTAN 50 MG PO TAB [76938]: 50 mg | ORAL | @ 14:00:00 | Stop: 2021-12-18 | NDC 68084034711

## 2021-12-18 MED ADMIN — METHOCARBAMOL 500 MG PO TAB [4971]: 1000 mg | ORAL | @ 14:00:00 | Stop: 2021-12-18 | NDC 00904705761

## 2021-12-18 MED ADMIN — CEFAZOLIN INJ 1GM IVP [210319]: 2 g | INTRAVENOUS | @ 14:00:00 | Stop: 2021-12-18 | NDC 60505614200

## 2021-12-18 MED ADMIN — CEFAZOLIN INJ 1GM IVP [210319]: 2 g | INTRAVENOUS | @ 04:00:00 | Stop: 2021-12-18 | NDC 60505614200

## 2021-12-18 MED ADMIN — TIZANIDINE 4 MG PO TAB [14793]: 2 mg | ORAL | @ 04:00:00 | NDC 00904641861

## 2021-12-18 MED ADMIN — LOSARTAN 50 MG PO TAB [76938]: 50 mg | ORAL | @ 01:00:00 | NDC 68084034711

## 2021-12-18 MED FILL — METHOCARBAMOL 500 MG PO TAB: 500 mg | ORAL | 15 days supply | Qty: 120 | Fill #1 | Status: CP

## 2021-12-18 MED FILL — OXYCODONE 5 MG PO TAB: 5 mg | ORAL | 13 days supply | Qty: 50 | Fill #1 | Status: CP

## 2021-12-19 ENCOUNTER — Encounter: Admit: 2021-12-19 | Discharge: 2021-12-19 | Payer: BC Managed Care – PPO

## 2021-12-19 DIAGNOSIS — I2510 Atherosclerotic heart disease of native coronary artery without angina pectoris: Secondary | ICD-10-CM

## 2021-12-19 DIAGNOSIS — G4733 Obstructive sleep apnea (adult) (pediatric): Secondary | ICD-10-CM

## 2021-12-19 DIAGNOSIS — E781 Pure hyperglyceridemia: Secondary | ICD-10-CM

## 2021-12-19 DIAGNOSIS — K219 Gastro-esophageal reflux disease without esophagitis: Secondary | ICD-10-CM

## 2021-12-19 DIAGNOSIS — L719 Rosacea, unspecified: Secondary | ICD-10-CM

## 2021-12-19 DIAGNOSIS — I10 Essential (primary) hypertension: Secondary | ICD-10-CM

## 2021-12-19 DIAGNOSIS — I219 Acute myocardial infarction, unspecified: Secondary | ICD-10-CM

## 2021-12-19 DIAGNOSIS — M5030 Other cervical disc degeneration, unspecified cervical region: Secondary | ICD-10-CM

## 2021-12-19 DIAGNOSIS — T148XXA Other injury of unspecified body region, initial encounter: Secondary | ICD-10-CM

## 2021-12-19 DIAGNOSIS — M2550 Pain in unspecified joint: Secondary | ICD-10-CM

## 2021-12-19 DIAGNOSIS — M5136 Other intervertebral disc degeneration, lumbar region: Secondary | ICD-10-CM

## 2021-12-19 DIAGNOSIS — M4800 Spinal stenosis, site unspecified: Secondary | ICD-10-CM

## 2021-12-19 DIAGNOSIS — M109 Gout, unspecified: Secondary | ICD-10-CM

## 2021-12-20 ENCOUNTER — Encounter: Admit: 2021-12-20 | Discharge: 2021-12-20 | Payer: BC Managed Care – PPO

## 2021-12-20 NOTE — Telephone Encounter
Contacted patient for update on recovery from recent Lumbar Laminectomy on 12/17/21 with Dr. Alma Friendly.  Patient reports doing okay since discharge, pain is manageable with medications, states is taking Robaxin every 6-8 hours, Tylenol/Oxy every 6 hours.  Reports transitions from sitting/lying/standing are most noticeable/difficult with pain.  States he is tolerating mobility, sitting up to eat/walking in home.  Endorses eating/drinking well, still working on bowel movement, is taking softeners/miralax twice daily and will do some Mag Citrate tonight.  Reports dressing still on incision, will remove Saturday.  No further questions, appointments confirmed, encouraged to call with any changes/needs/concerns.    Kem Kays, RN  Clinical Nurse Coordinator  Neurosurgery  [phone]

## 2022-01-07 ENCOUNTER — Encounter: Admit: 2022-01-07 | Discharge: 2022-01-07 | Payer: BC Managed Care – PPO

## 2022-01-07 DIAGNOSIS — Z981 Arthrodesis status: Secondary | ICD-10-CM

## 2022-01-07 NOTE — Patient Instructions
It was nice to see you today.  Thank you for choosing to visit our clinic.  Your time is important, and if you had to wait today, we do apologize.  Our goal is to run exactly on time, however, on occasion, we get behind in clinic due to unexpected patient issues.  We appreciate your patience.    General Instructions:  Scheduling:  Our scheduling phone number is [phone].  How to reach our office:  Please send a MyChart message to the [facility] or leave a voicemail for the nurse, Tehilla Coffel, RN, BSN at [phone]. Please note messages after 3 PM may not be answered until the next business day.  How to get a medication refill:  Please use the MyChart Refill request or contact your pharmacy directly to request medication refills.  Please allow 72 business hours for your request to be completed.    Support: for many chronic illnesses information is available through [facility] at [email or web address] or [phone].    For help with MyChart:  Please call [phone].    For questions on nights, weekends or holidays:  call the Operator at [phone], and ask for the doctor on call for Neurosurgery.    For more information on spinal conditions:  please visit [email or web address]   Our office fax number is: [phone]    Again, thank you for coming in today and allowing us to take part in your care.      Laloni Rowton, RN, BSN  Clinical Nurse Coordinator for Dr. Ifije Ohiorhenuan  Marc A. Asher, MD, [facility]  [facility]  Phone [phone]  Fax [phone]

## 2022-01-16 ENCOUNTER — Encounter: Admit: 2022-01-16 | Discharge: 2022-01-16 | Payer: BC Managed Care – PPO

## 2022-01-29 ENCOUNTER — Encounter: Admit: 2022-01-29 | Discharge: 2022-01-29 | Payer: BC Managed Care – PPO

## 2022-01-29 ENCOUNTER — Ambulatory Visit: Admit: 2022-01-29 | Discharge: 2022-01-29 | Payer: BC Managed Care – PPO

## 2022-01-29 DIAGNOSIS — T148XXA Other injury of unspecified body region, initial encounter: Secondary | ICD-10-CM

## 2022-01-29 DIAGNOSIS — L719 Rosacea, unspecified: Secondary | ICD-10-CM

## 2022-01-29 DIAGNOSIS — M4800 Spinal stenosis, site unspecified: Secondary | ICD-10-CM

## 2022-01-29 DIAGNOSIS — Z981 Arthrodesis status: Secondary | ICD-10-CM

## 2022-01-29 DIAGNOSIS — M5030 Other cervical disc degeneration, unspecified cervical region: Secondary | ICD-10-CM

## 2022-01-29 DIAGNOSIS — M5136 Other intervertebral disc degeneration, lumbar region: Secondary | ICD-10-CM

## 2022-01-29 DIAGNOSIS — E781 Pure hyperglyceridemia: Secondary | ICD-10-CM

## 2022-01-29 DIAGNOSIS — K219 Gastro-esophageal reflux disease without esophagitis: Secondary | ICD-10-CM

## 2022-01-29 DIAGNOSIS — I10 Essential (primary) hypertension: Secondary | ICD-10-CM

## 2022-01-29 DIAGNOSIS — G4733 Obstructive sleep apnea (adult) (pediatric): Secondary | ICD-10-CM

## 2022-01-29 DIAGNOSIS — M2550 Pain in unspecified joint: Secondary | ICD-10-CM

## 2022-01-29 DIAGNOSIS — I2510 Atherosclerotic heart disease of native coronary artery without angina pectoris: Secondary | ICD-10-CM

## 2022-01-29 DIAGNOSIS — I219 Acute myocardial infarction, unspecified: Secondary | ICD-10-CM

## 2022-01-29 DIAGNOSIS — Z951 Presence of aortocoronary bypass graft: Secondary | ICD-10-CM

## 2022-01-29 DIAGNOSIS — M109 Gout, unspecified: Secondary | ICD-10-CM

## 2022-01-29 DIAGNOSIS — E269 Hyperaldosteronism, unspecified: Secondary | ICD-10-CM

## 2022-01-29 NOTE — Progress Notes
61 year old male with history of a posterior cervical laminectomy and fusion and a lumbar laminectomy here for follow-up at 6 weeks after his lumbar laminectomy.  Overall he is doing very well.  Significant improvement in his low back pain.  He does continue have some numbness and tingling in his left leg and foot.  This is improved significantly.  But is still there.  Incision relatively well-healed small areas of granulation tissue with some mild erythema around the edges  Return to see me in clinic in 6 weeks

## 2022-01-29 NOTE — Patient Instructions
Please do not hesitate to call with any questions.  My nurse Dahlia Client can be reached [phone].     Please stop the potassium supplement    Increase spironolactone to 100mg  daily  Decrease losartan to 50mg  once daily    Please go to the lab in 2 weeks    Return to clinic in

## 2022-02-14 ENCOUNTER — Encounter: Admit: 2022-02-14 | Discharge: 2022-02-14 | Payer: BC Managed Care – PPO

## 2022-02-14 DIAGNOSIS — E269 Hyperaldosteronism, unspecified: Secondary | ICD-10-CM

## 2022-03-21 ENCOUNTER — Encounter: Admit: 2022-03-21 | Discharge: 2022-03-21 | Payer: BC Managed Care – PPO

## 2022-04-01 NOTE — Patient Instructions
It was nice to see you today.  Thank you for choosing to visit our clinic.  Your time is important, and if you had to wait today, we do apologize.  Our goal is to run exactly on time.  However, on occasion, we get behind in clinic due to unexpected patient issues.  Thank you for your patience.    General Instructions:  Scheduling:  Our scheduling phone number is [phone].  Appointment Reminders on your cell phone:  Communication preferences can be managed in MyChart to ensure you receive important appointment notifications  How to reach our office:  Please send a MyChart message to the [facility] or leave a voicemail for the nurse at [phone].  How to get a medication refill:  Please use the MyChart Refill request or contact your pharmacy directly to request medication refills.  Please allow 72 business hours for request to be completed.    Support for many chronic illnesses is available through [facility] at [email or web address] or [phone].    For help with MyChart:  please call [phone].    For questions on nights, weekends or holidays:  call the Operator at [phone], and ask for the doctor on call for Neurosurgery.    For more information on spinal conditions:  please visit [email or web address]     Again, thank you for coming in today.

## 2022-04-02 ENCOUNTER — Ambulatory Visit: Admit: 2022-04-02 | Discharge: 2022-04-02 | Payer: BC Managed Care – PPO

## 2022-04-02 ENCOUNTER — Encounter: Admit: 2022-04-02 | Discharge: 2022-04-02 | Payer: BC Managed Care – PPO

## 2022-04-02 DIAGNOSIS — M48062 Spinal stenosis, lumbar region with neurogenic claudication: Secondary | ICD-10-CM

## 2022-04-02 DIAGNOSIS — K219 Gastro-esophageal reflux disease without esophagitis: Secondary | ICD-10-CM

## 2022-04-02 DIAGNOSIS — G4733 Obstructive sleep apnea (adult) (pediatric): Secondary | ICD-10-CM

## 2022-04-02 DIAGNOSIS — M545 Chronic low back pain, unspecified back pain laterality, unspecified whether sciatica present: Secondary | ICD-10-CM

## 2022-04-02 DIAGNOSIS — T148XXA Other injury of unspecified body region, initial encounter: Secondary | ICD-10-CM

## 2022-04-02 DIAGNOSIS — M5030 Other cervical disc degeneration, unspecified cervical region: Secondary | ICD-10-CM

## 2022-04-02 DIAGNOSIS — E781 Pure hyperglyceridemia: Secondary | ICD-10-CM

## 2022-04-02 DIAGNOSIS — I10 Essential (primary) hypertension: Secondary | ICD-10-CM

## 2022-04-02 DIAGNOSIS — M5136 Other intervertebral disc degeneration, lumbar region: Secondary | ICD-10-CM

## 2022-04-02 DIAGNOSIS — M2550 Pain in unspecified joint: Secondary | ICD-10-CM

## 2022-04-02 DIAGNOSIS — M4800 Spinal stenosis, site unspecified: Secondary | ICD-10-CM

## 2022-04-02 DIAGNOSIS — I2510 Atherosclerotic heart disease of native coronary artery without angina pectoris: Secondary | ICD-10-CM

## 2022-04-02 DIAGNOSIS — M109 Gout, unspecified: Secondary | ICD-10-CM

## 2022-04-02 DIAGNOSIS — Z09 Encounter for follow-up examination after completed treatment for conditions other than malignant neoplasm: Secondary | ICD-10-CM

## 2022-04-02 DIAGNOSIS — I219 Acute myocardial infarction, unspecified: Secondary | ICD-10-CM

## 2022-04-02 DIAGNOSIS — L719 Rosacea, unspecified: Secondary | ICD-10-CM

## 2022-04-02 NOTE — Progress Notes
Date of Service: 04/02/2022        Chief complaint    Chief Complaint   Patient presents with   ? Follow Up     6 wk fuv       HPI  Brian Peterson is a 61 y.o. male in today for follow-up.  He is approximately 3 and half months status post L2-4 laminectomy for stenosis.  He is approximately 8 months status post C3-7 laminectomy/posterior spinal instrumented fusion.  He is doing well from a neck standpoint and not having any new symptoms.  From a low back standpoint he continues to improve on a monthly basis.  He continues to have a component of low back pain that radiates in the lower extremities with prolonged standing or walking but feels this is improving.  He has not had any formal outpatient therapy for his low back.  He denies any fevers, shakes or sweats.                       Oswestry: Oswestry Total Score:: 22       PMH    Medical History:   Diagnosis Date   ? Acid reflux     tums prn   ? CAD (coronary artery disease)    ? Degenerative disc disease, cervical    ? Degenerative disc disease, lumbar    ? Gout    ? Heart attack (HCC) 2017   ? High triglycerides    ? Hypertension    ? Joint pain    ? Nerve injury 05/20/1998    cervical fusions   ? OSA on CPAP    ? Rosacea    ? Spinal stenosis            ROS  Review of Systems   Musculoskeletal: Positive for back pain.   All other systems reviewed and are negative.         FH    Family History   Problem Relation Age of Onset   ? Hypertension Mother    ? Back pain Mother    ? Hypertension Brother    ? Heart Surgery Brother    ? Back pain Brother    ? Back pain Maternal Grandfather    ? Heart problem Brother    ? Back pain Brother    ? Heart problem Brother    ? Stroke Brother      Social History     Socioeconomic History   ? Marital status: Married   Occupational History   ? Occupation: Architectural technologist   Tobacco Use   ? Smoking status: Never   ? Smokeless tobacco: Never   Vaping Use   ? Vaping Use: Never used   Substance and Sexual Activity   ? Alcohol use: Yes     Alcohol/week: 1.0 standard drink of alcohol     Types: 1 Cans of beer per week   ? Drug use: Never   ? Sexual activity: Yes     Partners: Female     Birth control/protection: None           SH    Surgical History:   Procedure Laterality Date   ? HX CERVICAL FUSION  2000   ? LAMINECTOMY  2011    L3-4, L4-5   ? HX LUMBAR DISKECTOMY  2012   ? HX CHOLECYSTECTOMY  2015   ? HX CORONARY ARTERY BYPASS GRAFT  2017   ? Cervical 3  to Cervical 7 Posterior Cervical Fusion Cervical 3 to Cervical 7 Laminectomy N/A 07/26/2021    Performed by Jobe Marker, [facility] at [facility]   ? 22600--FUSION SPINE POSTERIOR - CERVICAL BELOW C2 N/A 07/26/2021    Performed by Jobe Marker, [facility] at [facility]   ? 63048--LAMINECTOMY/ FACETECTOMY/ FORAMINOTOMY WITH DECOMPRESSION - 1 VERTEBRAL SEGMENT - EACH ADDITIONAL CERVICAL/ THORACIC/ LUMBAR SEGMENT N/A 07/26/2021    Performed by Jobe Marker, [facility]   ? 22840--POSTERIOR NON-SEGMENTAL INSTRUMENTATION SPINE N/A 07/26/2021    Performed by Jobe [facility]er, [facility]   ? 20936--AUTOGRAFT - SPINE SURGERY ONLY N/A 07/26/2021    Performed by Jobe Marker, [facility]   ? [phone] (additional levels)--POSTERIOR SEGMENTAL INSTRUMENTATION - 3 TO 6 VERTEBRAL SEGMENTS N/A 07/26/2021    Performed by Jobe Marker, [facility]   ? Lumbar 2 to Lumbar 3, Lumbar 3 to Lumbar 4 Laminectomy (Open) N/A 12/17/2021    Performed by Jobe Marker, [facility]   ? 63048-LAMINECTOMY/ FACETECTOMY/ FORAMINOTOMY WITH DECOMPRESSION - 1 VERTEBRAL SEGMENT - EACH ADDITIONAL CERVICAL/ THORACIC/ LUMBAR SEGMENT N/A 12/17/2021    Performed by Jobe Marker, [facility]       Meds    ? acetaminophen (TYLENOL EXTRA STRENGTH) 500 mg tablet Take two tablets by mouth every 6 hours as needed for Pain. Max of 4,000 mg of acetaminophen in 24 hours.  Indications: pain   ? allopurinoL (ZYLOPRIM) 300 mg tablet Take one tablet by mouth daily. Take with food.   ? aspirin EC 81 mg tablet Take one tablet by mouth daily. Take with food.   ? CALCIUM PO Take 500 mg by mouth daily.   ? coenzyme Q10 50 mg capsule Take one capsule by mouth daily.   ? fenofibrate (LOFIBRA) 160 mg tablet Take one tablet by mouth daily. Take with food.   ? losartan (COZAAR) 50 mg tablet Take one tablet by mouth daily.   ? methocarbamoL (ROBAXIN) 500 mg tablet Take one tablet to two tablets by mouth four times daily as needed for Spasms or Muscle Cramps. Indications: muscle spasm   ? multivitamin (ONE-A-DAY) tablet Take one tablet by mouth daily.   ? oxyCODONE (ROXICODONE) 5 mg tablet Take one tablet to two tablets by mouth every 4 hours as needed for Pain. Indications: pain   ? spironolactone (ALDACTONE) 100 mg tablet Take one tablet by mouth daily for 360 days. Take with food.   ? tamsulosin (FLOMAX) 0.4 mg capsule Take one capsule by mouth at bedtime daily.       Allergies    Allergies   Allergen Reactions   ? Atorvastatin AGITATION   ? Rosuvastatin AGITATION         Exam  Physical Exam   Constitutional: he is oriented to person, place, and time. he appears well-developed and well-nourished.    Head: Normocephalic and atraumatic.    Eyes: Conjunctivae and EOM are normal.    Pulmonary/Chest: Effort normal. No respiratory distress.   Neurological: he is alert and oriented to person, place, and time. No cranial nerve deficit or sensory deficit. he exhibits normal muscle tone. Coordination normal.    Psychiatric: he has a normal mood and affect. his behavior is normal. Judgment and thought content normal.    Vitals reviewed.  The patient alert and oriented and in no acute distress.  Pt is ambulatory he does occasionally use a  cane and was previously using this all the time.    Lumbar incision was visualized and is well approximated/healed without redness/drainage-no concerns.  Lower extremity motor strength 5 out of 5 in all groups bilaterally.  Seated straight leg raise negative, no root tension sign.  No calf tenderness.  No clonus.      Vitals:   Vitals:    04/02/22 1110   BP: 135/86   Pulse: 76   SpO2: 97%   PainSc: Zero   Weight: 97.5 kg (215 lb)   Height: 172.7 cm (5' 8)     Body mass index is 32.69 kg/m?.      Imaging:   No new films to review today.    Assessment/Plan    Impression:  Approximately 3-1/2 months post L2-4 laminectomy (12/17/2021)-overall slowly improving but having some residual low back pain with prolonged standing  He is approximately 8 months status post C3-7 laminectomy/posterior spinal instrumented fusion (07/26/2021)-doing well from a neck standpoint      Recommend continuing as is and increasing mobility as pain permits.      Reassurance provided noting symptoms should continue to gradually improve over time.  Recommended formal physical therapy for his back early for gait training/balance in addition to lower extremity back strengthening-please see provided Rx  Patient is currently neurologically intact and was instructed to contact us if this were to change.  The patient is agreeable to the above treatment plan.  Several questions were answered and they were encouraged to contact us if symptoms changed/worsen or if any further questions were to develop.  At this point we will plan on follow-up at the 1 year postop mark from a cervical standpoint with new x-rays of the cervical spine         Orders Placed This Encounter   ? AMB REFERRAL TO PHYSICAL OR OCCUPATIONAL THERAPY     Referral Priority:   Routine     Referral Type:   Consult, Test & Treat     Referral Reason:   Specialty Services Required     Number of Visits Requested:   1     Expiration Date:   04/03/2023                              No orders of the defined types were placed in this encounter.                             Please note that documentation of records were done during a busy neurosurgical clinic. Attempts have been made to review the document for any errors. Please excuse for brevity and typographical errors.

## 2022-07-18 ENCOUNTER — Encounter: Admit: 2022-07-18 | Discharge: 2022-07-18 | Payer: BC Managed Care – PPO

## 2022-07-19 ENCOUNTER — Encounter: Admit: 2022-07-19 | Discharge: 2022-07-19 | Payer: BC Managed Care – PPO

## 2022-07-19 DIAGNOSIS — Z981 Arthrodesis status: Secondary | ICD-10-CM

## 2022-07-19 NOTE — Patient Instructions
It was nice to see you today.  Thank you for choosing to visit our clinic.  Your time is important, and if you had to wait today, we do apologize.  Our goal is to run exactly on time.  However, on occasion, we get behind in clinic due to unexpected patient issues.  Thank you for your patience.    General Instructions:  Scheduling:  Our scheduling phone number is [phone].  Appointment Reminders on your cell phone:  Communication preferences can be managed in MyChart to ensure you receive important appointment notifications  How to reach our office:  Please send a MyChart message to the [facility] or leave a voicemail for the nurse at [phone].  How to get a medication refill:  Please use the MyChart Refill request or contact your pharmacy directly to request medication refills.  Please allow 72 business hours for request to be completed.    Support for many chronic illnesses is available through [facility] at [email or web address] or [phone].    For help with MyChart:  please call [phone].    For questions on nights, weekends or holidays:  call the Operator at [phone], and ask for the doctor on call for Neurosurgery.    For more information on spinal conditions:  please visit [email or web address]     Again, thank you for coming in today.

## 2022-07-30 ENCOUNTER — Ambulatory Visit: Admit: 2022-07-30 | Discharge: 2022-07-30 | Payer: BC Managed Care – PPO

## 2022-07-30 ENCOUNTER — Encounter: Admit: 2022-07-30 | Discharge: 2022-07-30 | Payer: BC Managed Care – PPO

## 2022-07-30 DIAGNOSIS — M2550 Pain in unspecified joint: Secondary | ICD-10-CM

## 2022-07-30 DIAGNOSIS — G4733 Obstructive sleep apnea (adult) (pediatric): Secondary | ICD-10-CM

## 2022-07-30 DIAGNOSIS — T148XXA Other injury of unspecified body region, initial encounter: Secondary | ICD-10-CM

## 2022-07-30 DIAGNOSIS — M5030 Other cervical disc degeneration, unspecified cervical region: Secondary | ICD-10-CM

## 2022-07-30 DIAGNOSIS — L719 Rosacea, unspecified: Secondary | ICD-10-CM

## 2022-07-30 DIAGNOSIS — M4800 Spinal stenosis, site unspecified: Secondary | ICD-10-CM

## 2022-07-30 DIAGNOSIS — I219 Acute myocardial infarction, unspecified: Secondary | ICD-10-CM

## 2022-07-30 DIAGNOSIS — K219 Gastro-esophageal reflux disease without esophagitis: Secondary | ICD-10-CM

## 2022-07-30 DIAGNOSIS — I10 Essential (primary) hypertension: Secondary | ICD-10-CM

## 2022-07-30 DIAGNOSIS — E781 Pure hyperglyceridemia: Secondary | ICD-10-CM

## 2022-07-30 DIAGNOSIS — M5136 Other intervertebral disc degeneration, lumbar region: Secondary | ICD-10-CM

## 2022-07-30 DIAGNOSIS — I2510 Atherosclerotic heart disease of native coronary artery without angina pectoris: Secondary | ICD-10-CM

## 2022-07-30 DIAGNOSIS — Z981 Arthrodesis status: Secondary | ICD-10-CM

## 2022-07-30 DIAGNOSIS — M109 Gout, unspecified: Secondary | ICD-10-CM

## 2022-07-30 MED ORDER — LOSARTAN 50 MG PO TAB
50 mg | ORAL_TABLET | Freq: Every day | ORAL | 3 refills | 90.00000 days | Status: AC
Start: 2022-07-30 — End: ?

## 2022-10-23 ENCOUNTER — Encounter: Admit: 2022-10-23 | Discharge: 2022-10-23 | Payer: BC Managed Care – PPO

## 2022-10-30 ENCOUNTER — Encounter: Admit: 2022-10-30 | Discharge: 2022-10-30 | Payer: BC Managed Care – PPO

## 2023-01-21 ENCOUNTER — Ambulatory Visit: Admit: 2023-01-21 | Discharge: 2023-01-22 | Payer: BC Managed Care – PPO

## 2023-01-21 ENCOUNTER — Encounter: Admit: 2023-01-21 | Discharge: 2023-01-21 | Payer: BC Managed Care – PPO

## 2023-01-21 DIAGNOSIS — I10 Essential (primary) hypertension: Secondary | ICD-10-CM

## 2023-01-21 DIAGNOSIS — M4800 Spinal stenosis, site unspecified: Secondary | ICD-10-CM

## 2023-01-21 DIAGNOSIS — E781 Pure hyperglyceridemia: Secondary | ICD-10-CM

## 2023-01-21 DIAGNOSIS — T148XXA Other injury of unspecified body region, initial encounter: Secondary | ICD-10-CM

## 2023-01-21 DIAGNOSIS — K219 Gastro-esophageal reflux disease without esophagitis: Secondary | ICD-10-CM

## 2023-01-21 DIAGNOSIS — M5136 Other intervertebral disc degeneration, lumbar region: Secondary | ICD-10-CM

## 2023-01-21 DIAGNOSIS — L719 Rosacea, unspecified: Secondary | ICD-10-CM

## 2023-01-21 DIAGNOSIS — R809 Proteinuria, unspecified: Secondary | ICD-10-CM

## 2023-01-21 DIAGNOSIS — G4733 Obstructive sleep apnea (adult) (pediatric): Secondary | ICD-10-CM

## 2023-01-21 DIAGNOSIS — E269 Hyperaldosteronism, unspecified: Secondary | ICD-10-CM

## 2023-01-21 DIAGNOSIS — E876 Hypokalemia: Secondary | ICD-10-CM

## 2023-01-21 DIAGNOSIS — M2550 Pain in unspecified joint: Secondary | ICD-10-CM

## 2023-01-21 DIAGNOSIS — M5030 Other cervical disc degeneration, unspecified cervical region: Secondary | ICD-10-CM

## 2023-01-21 DIAGNOSIS — Z951 Presence of aortocoronary bypass graft: Secondary | ICD-10-CM

## 2023-01-21 DIAGNOSIS — I219 Acute myocardial infarction, unspecified: Secondary | ICD-10-CM

## 2023-01-21 DIAGNOSIS — M109 Gout, unspecified: Secondary | ICD-10-CM

## 2023-01-21 DIAGNOSIS — I2510 Atherosclerotic heart disease of native coronary artery without angina pectoris: Secondary | ICD-10-CM

## 2023-04-26 ENCOUNTER — Encounter: Admit: 2023-04-26 | Discharge: 2023-04-26 | Payer: BC Managed Care – PPO

## 2023-04-28 ENCOUNTER — Encounter: Admit: 2023-04-28 | Discharge: 2023-04-28 | Payer: BC Managed Care – PPO

## 2023-06-27 ENCOUNTER — Encounter: Admit: 2023-06-27 | Discharge: 2023-06-27 | Payer: BC Managed Care – PPO

## 2023-07-21 ENCOUNTER — Encounter: Admit: 2023-07-21 | Discharge: 2023-07-21 | Payer: BC Managed Care – PPO

## 2023-07-23 ENCOUNTER — Encounter: Admit: 2023-07-23 | Discharge: 2023-07-23 | Payer: BC Managed Care – PPO

## 2023-07-23 DIAGNOSIS — Z981 Arthrodesis status: Secondary | ICD-10-CM

## 2023-07-23 NOTE — Patient Instructions
 It was nice to see you today.  Thank you for choosing to visit our clinic.  Your time is important, and if you had to wait today, we do apologize.  Our goal is to run exactly on time.  However, on occasion, we get behind in clinic due to unexpected patient issues.  Thank you for your patience.    General Instructions:  Scheduling:  Our scheduling phone number is [phone].  Appointment Reminders on your cell phone:  Communication preferences can be managed in MyChart to ensure you receive important appointment notifications  How to reach our office:  Please send a MyChart message to the [facility] or leave a voicemail for the nurse at [phone].  How to get a medication refill:  Please use the MyChart Refill request or contact your pharmacy directly to request medication refills.  Please allow 72 business hours for request to be completed.    Support for many chronic illnesses is available through [employer] at [email or web address] or [phone].    For help with MyChart:  please call [phone].    For questions on nights, weekends or holidays:  call the Operator at [phone], and ask for the doctor on call for Neurosurgery.    For more information on spinal conditions:  please visit [email or web address]     Again, thank you for coming in today.

## 2023-07-29 ENCOUNTER — Encounter: Admit: 2023-07-29 | Discharge: 2023-07-29 | Payer: BC Managed Care – PPO

## 2023-08-05 ENCOUNTER — Encounter: Admit: 2023-08-05 | Discharge: 2023-08-05 | Payer: BC Managed Care – PPO

## 2023-08-05 ENCOUNTER — Ambulatory Visit: Admit: 2023-08-05 | Discharge: 2023-08-05 | Payer: BC Managed Care – PPO

## 2023-08-05 DIAGNOSIS — Z9889 Other specified postprocedural states: Secondary | ICD-10-CM

## 2023-08-05 DIAGNOSIS — M5442 Lumbago with sciatica, left side: Secondary | ICD-10-CM

## 2023-08-05 DIAGNOSIS — G959 Disease of spinal cord, unspecified: Secondary | ICD-10-CM

## 2023-08-05 DIAGNOSIS — Z981 Arthrodesis status: Secondary | ICD-10-CM

## 2023-08-05 NOTE — Progress Notes
 Date of Service: 08/05/2023        Chief complaint    Chief Complaint   Patient presents with    Neck - Follow Up     1y fuv       HPI  Brian Peterson is a 63 y.o. male in today for follow-up.  Has a history of a previous been L2-4 laminectomy for stenosis done on 12/17/2021.  Also presents 2 years status post C3-7 laminectomy/posterior spinal instrumented fusion.  Overall he states symptoms are tolerable more often than not although he feels like his balance perhaps has slightly gotten a little worse since his last office visit.  He has not had any falls.  He denies any progressive issues with fine motor movements involving the hands/fingers.  Denies any significant neck or upper extremity symptoms at this time.  He does complain of some increased low back pain and occasional left lower extremity radicular type symptoms when he stands or walks too long-he currently feels he is able to tolerate this as it is.  Maintains bowel/bladder habits.  He is doing well from a neck standpoint and not having any new symptoms.   Overall, he is doing very well.  Significant proved and neck pain as well as low back symptoms                     Oswestry: Oswestry Total Score:: (Patient-Rptd) 24       PMH    Past Medical History:    Acid reflux    CAD (coronary artery disease)    Degenerative disc disease, cervical    Degenerative disc disease, lumbar    Gout    Heart attack (CMS-HCC)    High triglycerides    Hypertension    Joint pain    Nerve injury    OSA on CPAP    Rosacea    Spinal stenosis           ROS  Review of Systems   Musculoskeletal:  Positive for back pain and neck pain.   All other systems reviewed and are negative.         FH    Family History   Problem Relation Name Age of Onset    Hypertension Mother [place]     Back pain Mother [place]     Hypertension Brother Doug     Heart Surgery Brother Doug     Back pain Brother Doug     Back pain Maternal Grandfather Sherilyn Cooter     Heart problem Brother Mellody Dance     Back pain Brother Mellody Dance     Heart problem Brother Iantha Fallen     Stroke Brother Secretary/administrator      Social History     Socioeconomic History    Marital status: Married   Occupational History    Occupation: Architectural technologist   Tobacco Use    Smoking status: Never    Smokeless tobacco: Never   Vaping Use    Vaping status: Never Used   Substance and Sexual Activity    Alcohol use: Yes     Alcohol/week: 1.0 standard drink of alcohol     Types: 1 Cans of beer per week    Drug use: Never    Sexual activity: Not Currently     Partners: Female     Birth control/protection: None           SH    Surgical History:   Procedure Laterality Date  HX CERVICAL FUSION  2000    HX LUMBAR DISKECTOMY  2012    HX CHOLECYSTECTOMY  2015    HX CORONARY ARTERY BYPASS GRAFT  2017    Cervical 3 to Cervical 7 Posterior Cervical Fusion Cervical 3 to Cervical 7 Laminectomy N/A 07/26/2021    Performed by [facility]obe Marker, [facility]    22600--FUSION SPINE POSTERIOR - CERVICAL BELOW C2 N/A 07/26/2021    Performed by Jobe Mark[facility], [facility]    63048--LAMINECTOMY/ FACETECTOMY/ FORAMINOTOMY WITH DECOMPRESSION - 1 VERTEBRAL SEGMENT - EACH ADDITIONAL CERVICAL/ THORACIC/ LUMBAR SEGMENT N/A 07/26/2021    Performed by [facility]obe [facility], [facility]    22840--POSTERIOR NON-SEGMENTAL INSTRUMENTATION SPINE N/A 07/26/2021    Performed by [facility]obe [facility], [facility]    [id number] - SPINE SURGERY ONLY N/A 07/26/2021    Performed by [facility]obe [facility], [facility]    [phone] (additional levels)--POSTERIOR SEGMENTAL INSTRUMENTATION - 3 TO 6 VERTEBRAL SEGMENTS N/A 07/26/2021    Performed by [facility]obe [facility], [facility]    Lumbar 2 to Lumbar 3, Lumbar 3 to Lumbar 4 Laminectomy (Open) N/A 12/17/2021    Performed by [facility]obe [facility], [facility]    63048-LAMINECTOMY/ FACETECTOMY/ FORAMINOTOMY WITH DECOMPRESSION - 1 VERTEBRAL SEGMENT - EACH ADDITIONAL CERVICAL/ THORACIC/ LUMBAR SEGMENT N/A 12/17/2021    Performed by Jobe Marker, MD at [facility]    LAMINECTOMY  2023    L3-4, L4-5       Meds     allopurinoL (ZYLOPRIM) 300 mg tablet Take one tablet by mouth daily. Take with food.    aspirin EC 81 mg tablet Take one tablet by mouth daily. Take with food.    CALCIUM PO Take 500 mg by mouth daily.    eplerenone (INSPRA) 50 mg tablet TAKE 1 TABLET BY MOUTH EVERY DAY    fenofibrate (LOFIBRA) 160 mg tablet Take one tablet by mouth daily. Take with food.    multivitamin (ONE-A-DAY) tablet Take one tablet by mouth daily.    tamsulosin (FLOMAX) 0.4 mg capsule Take one capsule by mouth at bedtime daily.       Allergies    Allergies   Allergen Reactions    Atorvastatin AGITATION    Rosuvastatin AGITATION         Exam  Physical Exam   Constitutional: he is oriented to person, place, and time. he appears well-developed and well-nourished.    Head: Normocephalic and atraumatic.    Eyes: Conjunctivae and EOM are normal.    Pulmonary/Chest: Effort normal. No respiratory distress.   Neurological: he is alert and oriented to person, place, and time. No cranial nerve deficit or sensory deficit. he exhibits normal muscle tone. Coordination normal.    Psychiatric: he has a normal mood and affect. his behavior is normal. Judgment and thought content normal.    Vitals reviewed.  The patient alert and oriented and in no acute distress.  Pt is ambulatory he does occasionally use a cane and was previously using this all the time.    Cervical/lumbar incisions are is well approximated/healed without redness/drainage-no concerns.  Bilateral upper/lower extremity motor strength 5 out of 5 in all groups bilaterally.  Seated straight leg raise negative, no root tension sign.  No calf tenderness.  No clonus.  Hoffmann's noted      Vitals:   Vitals:    08/05/23 1333   BP: (!) 156/96  BP Source: Arm, Left Upper   Pulse: 66   SpO2: 97%   PainSc: Zero   Weight: 100.7 kg (222 lb)   Height: 175.3 cm (5' 9)     Body mass index is 32.78 kg/m?.      Imaging:   X-rays reviewed: Stable hardware position compared to previous x-rays    Assessment/Plan    63 year old male history of cervical fusion as well as a lumbar decompression here for follow-up at approximately 1 year after his cervical fusion he does that his balance has gotten slightly worse than when he was here previously a year ago.  He also has been having more recurrent low back pain that radiates into the left lower extremity at times however notes this is usually with prolonged standing or walking and is improved with rest  Reviewed films/case with Dr. [place] today.  Reviewed films/pathology once again with the patient.  At this point he feels he is able to tolerate symptoms more often than not can live with them as they currently are.  I did recommend some outpatient physical therapy for his cervical/lumbar regions-gait/balance-please see provided Rx.  We discussed considering cervical in addition to lumbar MRIs further to evaluate his symptoms should they progress.  At this point he states symptoms are tolerable more often than not and would like to continue as is and try some physical therapy  With respect to his neck, he is overall doing fairly well however feeling like he is having some worsening balance/gait issues.  Stable cervical hardware position noted  With respect to his low back, significant improvement in his low back pain where he was preoperatively however he does get some occasional left lower extremity radicular type symptoms when he stands or walks too long  At this point plan on follow-up in 1 year for reevaluation-will obtain new standing films at that point.  He was encouraged to contact us if we need to proceed with imaging sooner                                No orders of the defined types were placed in this encounter.                             Please note that documentation of records were done during a busy neurosurgical clinic. Attempts have been made to review the document for any errors. Please excuse for brevity and typographical errors.

## 2023-08-06 ENCOUNTER — Encounter: Admit: 2023-08-06 | Discharge: 2023-08-06 | Payer: BC Managed Care – PPO

## 2023-08-06 NOTE — Telephone Encounter
 08-06-2023 Per Task Message, request faxed to Dr Wilford Grist, (F) [phone], Dr Scotty Court records are in Care Everywhere, clp      Requesting Records  Dr. Wilford Grist (PCP)  [address]. [place] [address]  Ph. [phone]      Collect Outside Records Not Needed 08/13/2023 Requesting records  Dr. Scotty Court (Cardiology)  [address] [place] [address]  Ph. [phone]

## 2023-08-12 ENCOUNTER — Encounter: Admit: 2023-08-12 | Discharge: 2023-08-12 | Payer: BC Managed Care – PPO

## 2023-08-12 DIAGNOSIS — I493 Ventricular premature depolarization: Secondary | ICD-10-CM

## 2023-08-12 DIAGNOSIS — Z951 Presence of aortocoronary bypass graft: Secondary | ICD-10-CM

## 2023-08-12 NOTE — Progress Notes
 STAT    Request for the following medical records [facility] for purpose of continuity of care. Patient has an upcoming cardiology appointment 08/26/2023.     Patient: Brian Peterson  DOB: Oct 07, 1960    Please Fax:  [facility] Operative report from 2017            Please fax to: [phone]  Cardiology Services at the [facility]  Attention: Medical Record/Dr Barry Dienes    Thank you

## 2023-08-12 NOTE — Progress Notes
 CARDIAC NEW PATIENT PROFILE      PHYSICIANS INFORMATION:   REFERRING PHYSICIAN: Self referral    PCP: Wilford Grist, MD   OTHER PROVIDERS:    Malachy Chamber, MD Nephrology ([place])   Maurine Simmering, MD Cardiology [facility])       REASON FOR VISIT/DIAGNOSIS: Self referral. Establishing care and Second option on diagnosis for cardic cath from present cardiologist Dr. Scotty Court.     RECENT EVENTS/SYMPTOMS:   07/31/2023: Patient seen and evaluated by Cardiologist Scotty Court, MD. Per note Patient still reports that he is not having symptoms of chest pain or chest discomforts. He denies any significant shortness of breath on exertion. The question is what to do with the information above. Nuclear stress test suggested a perfusion defect located in the lateral wall with a sum difference score of 6%. Left ventricular systolic function was 67% on post-rest gated SPECT images. These findings were not consistent with a high risk scan. Patient's wife was asking whether we should continue to remain conservative on medical therapy and wait for myocardial infarction. Therefore consideration of being proactive and performing a heart catheterization for possible intervention is entertained. At this present time, the patient is unsure as to how to proceed. My suspicion is this with his saphenous vein graft to the right coronary artery and obtuse marginal system totally occluded, the patient may not feel the effects of that ischemia. Risk and benefits were discussed with the patient. It is my opinion that we should go forth with a coronary angiogram to redefine the patient's coronary anatomy. The patient and his wife would like to think about this decision prior to going forward. I offered the patient to be seen at [facility] cardiology office across the hall as part of second opinion. They may be able to offer advice that may assist in the patient making the decision. The patient is currently on aspirin 81 mg daily as well as Tenormin 25 mg daily. The patient has been restarted on his atorvastatin but not at high intensity dose. He had a history of an allergy to atorvastatin which he reported as anxiety. We also had episodes of elevated total CK. He is on fenofibrate for hypertriglyceridemia. The patient was started on eplerenone by [facility] for elevated aldosterone and hypokalemia. 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 Consideration for PCSK9 inhibition if the patient is unable to tolerate higher doses of atorvastatin. Goal total cholesterol is less than 200 with LDL cholesterol less than 55. Patient obtains fasting lipid profiles at his primary care's office and will therefore leave this in his very capable hands.     PERTINENT CARDIAC HISTORY: Coronary artery disease; S/P CABG x5; Essential Hypertension; Dyslipidemia; PVC    OTHER MEDICAL HISTORY: Pre-diabetes; Hyperaldosteronism; Gout    MOST RECENT PERTINENT TESTING/PROCEDURES:   10/28/2022 Holter Monitor Report: The patient wore a Holter monitor for 24 hours. The underlying rhythm is sinus. The minimum heart rate was 51 beats per minute with a peak heart rate of 124 beats per minute and mean of 73 beats per minute. There are infrequent PVC's totaling 1.4%. There were no couplets and no runs. There were no pauses in the rhythm. This 24 hours Holter monitor demonstrated infrequent PVC's with a burden of 1.4%.  06/12/2023 Stress Test: Normal sinus rhythm. There was 0.5 to 1 mm of stress induced depression involving the leads I and aVL. No exercise induced chest pain. Recommend Lexiscan nuclear stress test to define  extent of ischemia prior to invasive diagnostic study.   06/12/2023 Echo: Normal left ventricular size. There is hyperdynamic left ventricular systolic function. LV Ejection fraction was 71%. Moderate left ventricular hypertrophy. The left atrium is normal in size. There is mild mitral valve regurgitation. Mitral valve leaflets appear mildly thickened. Trileaflet aortic valve. Aortic cusps appear mildly calcified. The peak transaortic velocity was 140 cm/sec. The mean transaortic gradient was 5 mmHg. The aortic valve area by the continuity equation (using VTI) was 2.37 cm2. The aortic valve area indexed to the BSA is 1.06 cm2/m2. Normal right ventricular systolic pressure. There is mild tricuspid regurgitation. Estimated peak RVSP is 29 mmHg. No significant pericardial effusion. The inferior vena cava is of normal size. The IVC diameter was 20 mm. The inferior vena cava shows a normal respiratory collapse consistent with normal right atrial pressure (3 mmHg)/ .  06/25/2023 Lexiscan Nuclear Stress Test: Global ventricular function is normal. Left Ventricular Ejection Fraction is 67%. Myocardial perfusion images a show a medium defect during stress of mild to moderate intensity. It is a fixed perfusion abnormality located in the basal anterolateral segment, a partially reversible perfusion abnormality located in the apical lateral segment, a reversible perfusion abnormality located in the mid inferolateral segment. Sum difference score is 6% to suggest moderate ischemic burden. Findings are consistent with partial ischemia of the circumflex. Myocardial perfusion imaging is abnormal. Recommendations: Invasive diagnostic.  07/15/2023 CT Cardiac: Status post coronary artery bypass surgery. The LIMA graft to the LAD is patent. The identified vein grafts are occluded. The native circulation demonstrates severe stenoses/occlusions in all vessels as above.     PERTINENT SURGERIES:   03/11/2016: 5 vessel coronary artery bypass grafting surgery      PERTINENT CARDIAC FAMILY HISTORY:   Mother: Hypertension  Brother: Hypertension  Brother: Heart problem; stroke    MEDICATIONS/ALLERGIES: In chart     LAB: Entered to chart     RECORDS:   Records requested from [facility] for 2017 CABG Operative report. Fax [phone]). Records requested from [facility] for most recent Lipid panel. Fax [phone])    IMAGES: Requested echo and stress test images from [facility] via [employer].    Chart book marked.

## 2023-08-12 NOTE — Progress Notes
 STAT    Request for the following medical records [facility] for purpose of continuity of care. Patient has an upcoming cardiology appointment.     Patient: Brian Peterson  DOB: [birth year]    Please Fax:  Most recent lipid profile            Please fax to: [phone]  Cardiology Services at the [facility]  Attention: Medical Record/Dr Barry Dienes    Thank you

## 2023-08-19 ENCOUNTER — Encounter: Admit: 2023-08-19 | Discharge: 2023-08-19

## 2023-08-20 ENCOUNTER — Encounter: Admit: 2023-08-20 | Discharge: 2023-08-20

## 2023-08-26 ENCOUNTER — Encounter: Admit: 2023-08-26 | Discharge: 2023-08-26

## 2023-08-26 ENCOUNTER — Ambulatory Visit: Admit: 2023-08-26 | Discharge: 2023-08-27

## 2023-08-26 DIAGNOSIS — I2510 Atherosclerotic heart disease of native coronary artery without angina pectoris: Secondary | ICD-10-CM

## 2023-08-26 DIAGNOSIS — I493 Ventricular premature depolarization: Secondary | ICD-10-CM

## 2023-08-26 DIAGNOSIS — E782 Mixed hyperlipidemia: Secondary | ICD-10-CM

## 2023-08-26 DIAGNOSIS — I10 Essential (primary) hypertension: Secondary | ICD-10-CM

## 2023-08-26 DIAGNOSIS — Z136 Encounter for screening for cardiovascular disorders: Secondary | ICD-10-CM

## 2023-08-26 MED ORDER — ATORVASTATIN 40 MG PO TAB
40 mg | ORAL_TABLET | Freq: Every day | ORAL | 3 refills | Status: AC
Start: 2023-08-26 — End: ?

## 2023-08-26 NOTE — Assessment & Plan Note
 Palpitation symptoms did improve after starting low-dose atenolol.

## 2023-08-26 NOTE — Assessment & Plan Note
 He does have evidence of cardiac ischemia by stress perfusion imaging and the CCTA demonstrates severe stenoses in the native RCA and circumflex coronaries.  Vein grafts to these vessels are occluded.  I've recommended cors/possible and we'll try to get this scheduled.

## 2023-08-26 NOTE — Progress Notes
 Date of Service: 08/26/2023    Brian Peterson is a 63 y.o. male.       HPI     Brian Peterson and his wife were in the [place] clinic today for another opinion regarding management of his coronary disease.  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  He was found to have 3-vessel disease and underwent CABG X 5 at [facility].  LIMA-LAD, SVG-OM1-OM2-RPD, and SVG-LADD.  He had fairly typical angina with his infarct, but no preceding cardiac ischemic symptoms.    He didn't really have much in the way of risk factors.  He'd been effectively treated for hypertension since his early 61s.  His lipid levels were pretty average and he had no history of diabetes or tobacco use.    He was treated with statin after his bypass surgery, but had elevations in CK levels such that statins were discontinued about 5 years ago.  It was never clear whether the CK elevation was due to statins; he never had myalgia or myositis symptoms.  There was an attempt to get him on PCS-K9, but insurance wouldn't cover the medication.  His lipid disorder hasn't been treated for about 5 years.    Back in June, 2024, he went to the [facility] ED with palpitations.  He was referred back to [facility] where a 24-hour Holter showed a 1.4% PVC burden.  Subsequent pharmacologic nuclear perfusion study demonstrated EF 67%, septal hypokinesis, and an inferolateral reversible defect.  CCTA was done at [facility] on 07[facility]15[facility].  This demonstrated occlusion of both vein grafts.  The LIMA-LAD was patent.  The native RCA had severe proximal stenosis and the proximal/mid circumflex had severe stenosis, but both native coronaries were described as being patent.  There was discussion about subsequent coronary arteriography, but the patient indicates today that he'd rather have that done at [place] if necessary.    He's not having angina, but he didn't have angina prior to the NSTEMI in 2017 that led to the discovery of severe 3-vessel CAD.  He hasn't noticed increased dyspnea or a decline in exercise tolerance.  He's been limited by chronic back pain and back surgery left him with enough gait instability that he has to use a cane when walking.       Vitals:    08/26/23 1317   BP: (!) 172/94   BP Source: Arm, Left Upper   Pulse: 67   SpO2: 97%   PainSc: Zero   Weight: 101.1 kg (222 lb 12.8 oz)   Height: 175.3 cm (5' 9)     Body mass index is 32.9 kg/m?Marland Kitchen     Past Medical History  Patient Active Problem List    Diagnosis Date Noted    History of left heart catheterization (LHC)      03/07/2016 Left Heart Catheterization: NSTEMI. Severe multivessel CAD. LMCA with minimal luminel irregularities. LAD with a long 70% lesion in the proximal-mid segments. Lcx with long 70% lesions. RCA with 70% lesions in the proximal segment. There is thrombus with a 100% occlusion of the RPDA and PRL branches (culprit for NSTEMI).       PVC (premature ventricular contraction)      Per Scotty Court, MD 07/31/2023.   10/28/2022 Holter Monitor Report: The patient wore a Holter monitor for 24 hours. The underlying rhythm is sinus. The minimum heart rate was 51 beats per minute with a peak heart rate of 124 beats  per minute and mean of 73 beats per minute. There are infrequent PVC's totaling 1.4%. There were no couplets and no runs. There were no pauses in the rhythm. This 24 hours Holter monitor demonstrated infrequent PVC's with a burden of 1.4%.      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 There is hyperdynamic left ventricular systolic function. LV Ejection fraction was 71%. Moderate left ventricular hypertrophy. The left atrium is normal in size. There is mild mitral valve regurgitation. Mitral valve leaflets appear mildly thickened. Trileaflet aortic valve. Aortic cusps appear mildly calcified. The peak transaortic velocity was 140 cm/sec. The mean transaortic gradient was 5 mmHg. The aortic valve area by the continuity equation (using VTI) was 2.37 cm2. The aortic valve area indexed to the BSA is 1.06 cm2/m2. Normal right ventricular systolic pressure. There is mild tricuspid regurgitation. Estimated peak RVSP is 29 mmHg. No significant pericardial effusion. The inferior vena cava is of normal size. The IVC diameter was 20 mm. The inferior vena cava shows a normal respiratory collapse consistent with normal right atrial pressure (3 mmHg)/ .      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 There was 0.5 to 1 mm of stress induced depression involving the leads I and aVL. No exercise induced chest pain. Recommend Lexiscan nuclear stress test to define extent of ischemia prior to invasive diagnostic study.   06/25/2023 Lexiscan Nuclear Stress Test: Global ventricular function is normal. Left Ventricular Ejection Fraction is 67%. Myocardial perfusion images a show a medium defect during stress of mild to moderate intensity. It is a fixed perfusion abnormality located in the basal anterolateral segment, a partially reversible perfusion abnormality located in the apical lateral segment, a reversible perfusion abnormality located in the mid inferolateral segment. Sum difference score is 6% to suggest moderate ischemic burden. Findings are consistent with partial ischemia of the circumflex. Myocardial perfusion imaging is abnormal. Recommendations: Invasive diagnostic.  07/15/2023 CT Cardiac: Status post coronary artery bypass surgery. The LIMA graft to the LAD is patent. The identified vein grafts are occluded. The native circulation demonstrates severe stenoses/occlusions in all vessels as above.       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 Heide Scales, MD.      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   HENT: Negative.     Eyes: Negative.    Cardiovascular:  Positive for dyspnea on exertion.   Respiratory: Negative.     Endocrine: Negative.    Hematologic/Lymphatic: Negative.    Skin: Negative.    Musculoskeletal: Negative.    Gastrointestinal: Negative.    Genitourinary: Negative.    Neurological: Negative.  Psychiatric/Behavioral: Negative.     Allergic/Immunologic: Negative.        Physical Exam    Physical Exam   General Appearance: no distress   Skin: warm, no ulcers or xanthomas   Digits and Nails: no cyanosis or clubbing   Eyes: conjunctivae and lids normal, pupils are equal and round   Teeth/Gums/Palate: dentition unremarkable, no lesions   Lips & Oral Mucosa: no pallor or cyanosis   Neck Veins: normal JVP , neck veins are not distended   Thyroid: no nodules, masses, tenderness or enlargement   Chest Inspection: chest is normal in appearance   Respiratory Effort: breathing comfortably, no respiratory distress   Auscultation/Percussion: lungs clear to auscultation, no rales or rhonchi, no wheezing   PMI: PMI not enlarged or displaced   Cardiac Rhythm: regular rhythm and normal rate   Cardiac Auscultation: S1, S2 normal, no rub, no gallop   Murmurs: no murmur   Peripheral Circulation: normal peripheral circulation   Carotid Arteries: normal carotid upstroke bilaterally, no bruits   Radial Arteries: normal symmetric radial pulses   Abdominal Aorta: no abdominal aortic bruit   Pedal Pulses: normal symmetric pedal pulses   Lower Extremity Edema: no lower extremity edema   Abdominal Exam: soft, non-tender, no masses, bowel sounds normal   Liver & Spleen: no organomegaly   Gait & Station: walks without assistance   Muscle Strength: normal muscle tone   Orientation: oriented to time, place and person   Affect & Mood: appropriate and sustained affect   Language and Memory: patient responsive and seems to comprehend information   Neurologic Exam: neurological assessment grossly intact   Other: moves all extremities      Cardiovascular Studies    EKG:  SR, rate 60.  LAFB.    Cardiovascular Health Factors  Vitals BP Readings from Last 3 Encounters:   08/26/23 (!) 172/94   08/05/23 (!) (P) 141/73   08/05/23 (!) 156/96     Wt Readings from Last 3 Encounters:   08/26/23 101.1 kg (222 lb 12.8 oz)   08/05/23 100.7 kg (222 lb)   01/21/23 101.7 kg (224 lb 4.8 oz)     BMI Readings from Last 3 Encounters:   08/26/23 32.90 kg/m?   08/05/23 32.78 kg/m?   08/05/23 32.78 kg/m?      Smoking Social History     Tobacco Use   Smoking Status Never   Smokeless Tobacco Never      Lipid Profile Cholesterol   Date Value Ref Range Status   07/17/2022 178  Final     HDL   Date Value Ref Range Status   07/17/2022 30  Final     LDL   Date Value Ref Range Status   07/17/2022 100  Final     Triglycerides   Date Value Ref Range Status   07/17/2022 244  Final      Blood Sugar No results found for: HGBA1C  Glucose   Date Value Ref Range Status   05/28/2023 124  Final   02/13/2022 112  Final   12/18/2021 144 (H) 70 - 100 MG/DL Final          Problems Addressed Today  Encounter Diagnoses   Name Primary?    Screening for heart disease Yes    Essential hypertension     Mixed hyperlipidemia     Athscl heart disease of native coronary artery w/o ang pctrs     PVC (premature ventricular contraction)  Assessment and Plan       Essential hypertension  Atenolol 25/d, eplerenone 50/d.  He's been on BP medications since he was in his 67s.  He has a diagnosis of hyperaldosteronism and had gynecomastia while on spironolactone.  The eplerenone generally controls his BP well and the atenolol was added due to the symptomatic PVCs.    Mixed hyperlipidemia  He hasn't been treated with cholesterol-lowering medication for about 5 years.  Apparently he had elevated CK on both rosuva and atorva, but no myalgia symptoms.  Due to the lab abnormality the statin was stopped and just doesn't seem to have ever been re-started.    I sent an order for an updated lipid profile as well as Lp(a).  The goal for his LDL should probably be around 14, given his [place] age at the time of his CAD diagnosis.  He thinks that he can take atorvastatin again, but I thought we should re-check a lipid profile and CK level before re-starting.      Athscl heart disease of native coronary artery w/o ang pctrs  He does have evidence of cardiac ischemia by stress perfusion imaging and the CCTA demonstrates severe stenoses in the native RCA and circumflex coronaries.  Vein grafts to these vessels are occluded.  I've recommended cors/possible and we'll try to get this scheduled.    PVC (premature ventricular contraction)  Palpitation symptoms did improve after starting low-dose atenolol.      Current Medications (including today's revisions)   allopurinoL (ZYLOPRIM) 300 mg tablet Take one tablet by mouth daily. Take with food.    aspirin EC 81 mg tablet Take one tablet by mouth daily. Take with food.    atenolol (TENORMIN) 25 mg tablet Take one tablet by mouth daily.    atorvastatin (LIPITOR) 40 mg tablet Take one tablet by mouth daily.    CALCIUM PO Take 500 mg by mouth daily.    eplerenone (INSPRA) 50 mg tablet Take one tablet by mouth daily.    fenofibrate (LOFIBRA) 160 mg tablet Take one tablet by mouth daily. Take with food.    multivitamin (ONE-A-DAY) tablet Take one tablet by mouth daily.    tamsulosin (FLOMAX) 0.4 mg capsule Take one capsule by mouth at bedtime daily.     Total time spent on today's office visit was 60 minutes.  This includes face-to-face in person visit with patient as well as nonface-to-face time including review of the EMR, outside records, labs, radiologic studies, echocardiogram & other cardiovascular studies, formation of treatment plan, after visit summary, future disposition, and lastly on documentation.

## 2023-08-26 NOTE — Assessment & Plan Note
 He hasn't been treated with cholesterol-lowering medication for about 5 years.  Apparently he had elevated CK on both rosuva and atorva, but no myalgia symptoms.  Due to the lab abnormality the statin was stopped and just doesn't seem to have ever been re-started.    I sent an order for an updated lipid profile as well as Lp(a).  The goal for his LDL should probably be around 54, given his [place] age at the time of his CAD diagnosis.  He thinks that he can take atorvastatin again, but I thought we should re-check a lipid profile and CK level before re-starting.

## 2023-08-27 ENCOUNTER — Encounter: Admit: 2023-08-27 | Discharge: 2023-08-27

## 2023-08-28 IMAGING — US CARDUPBI
1 series · 14 of 16 positions shown · non-contrast
Comparison: none

[Series 1: us carotid duplex bi · 14 of 64 slices shown]
[im 1/64]
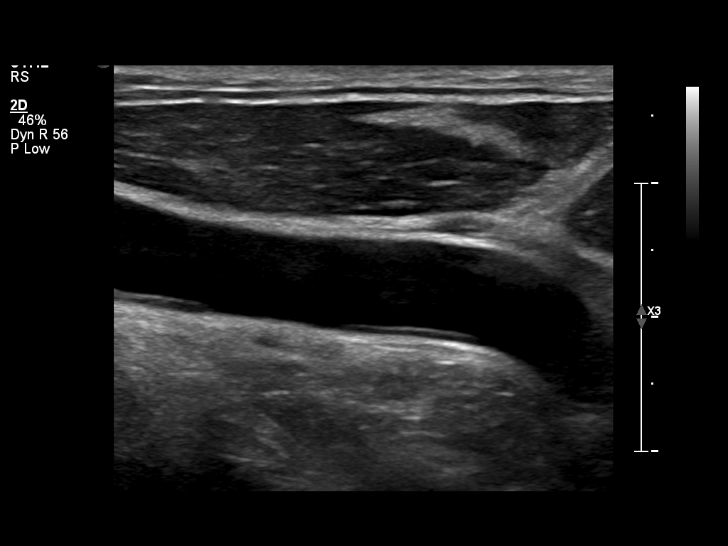
[im 5/64]
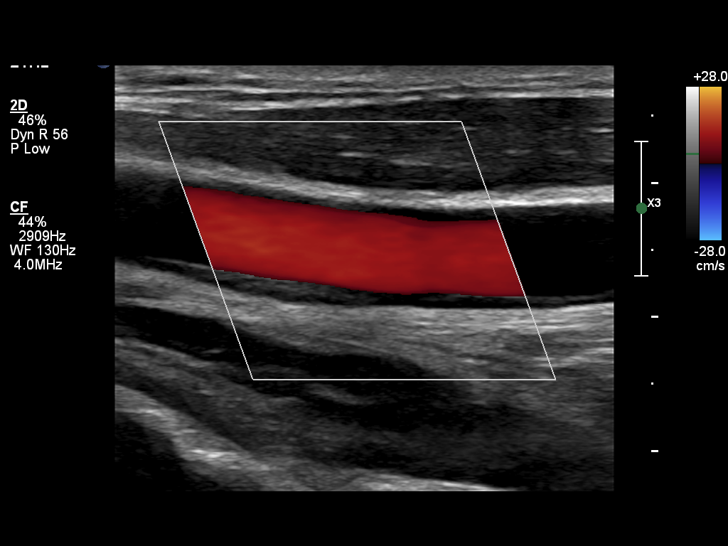
[im 9/64]
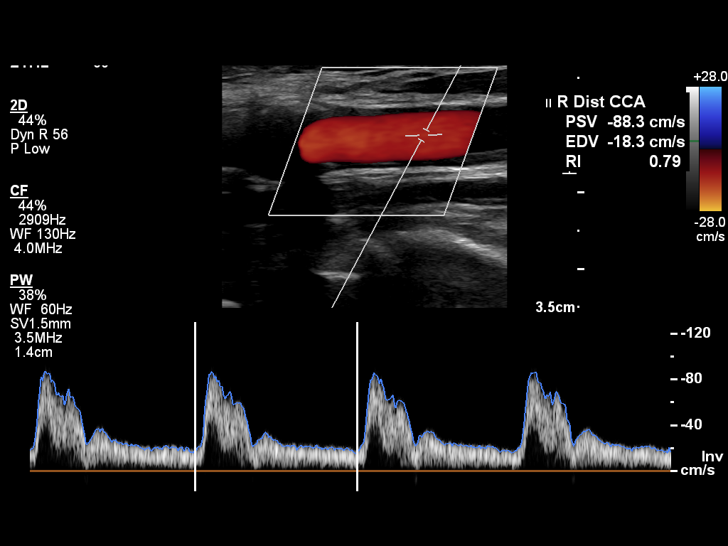
[im 17/64]
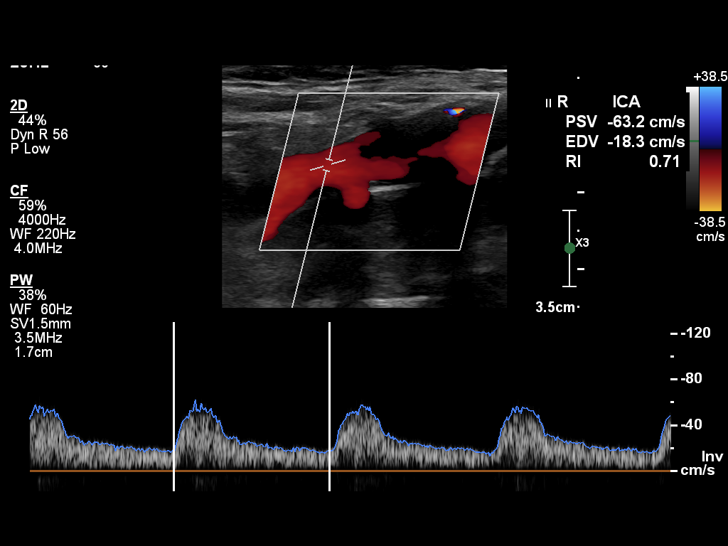
[im 22/64]
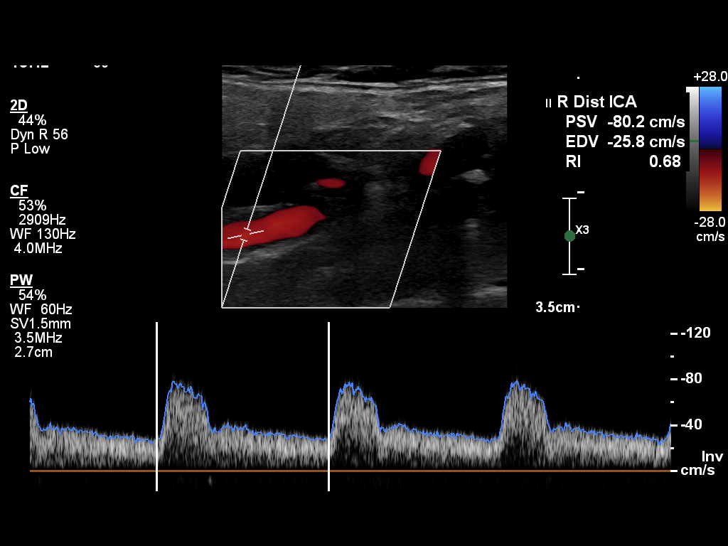
[im 26/64]
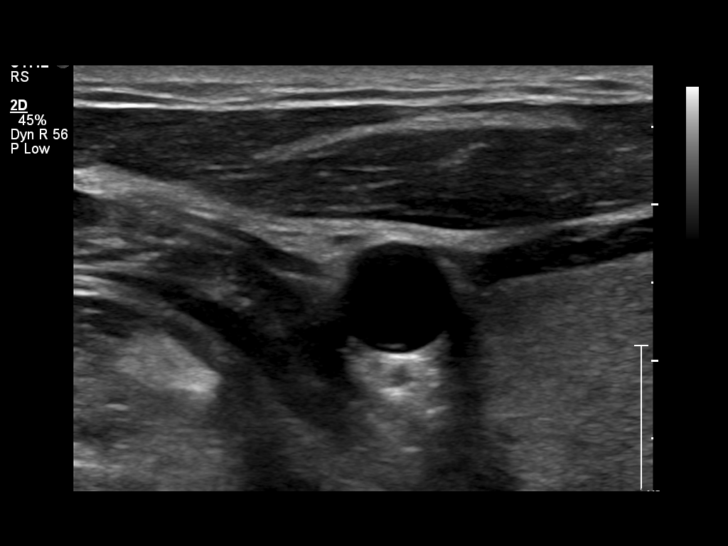
[im 30/64]
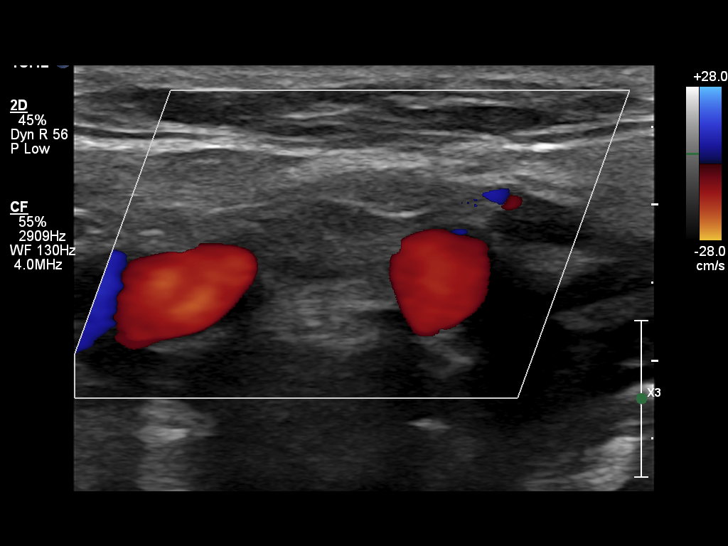
[im 34/64]
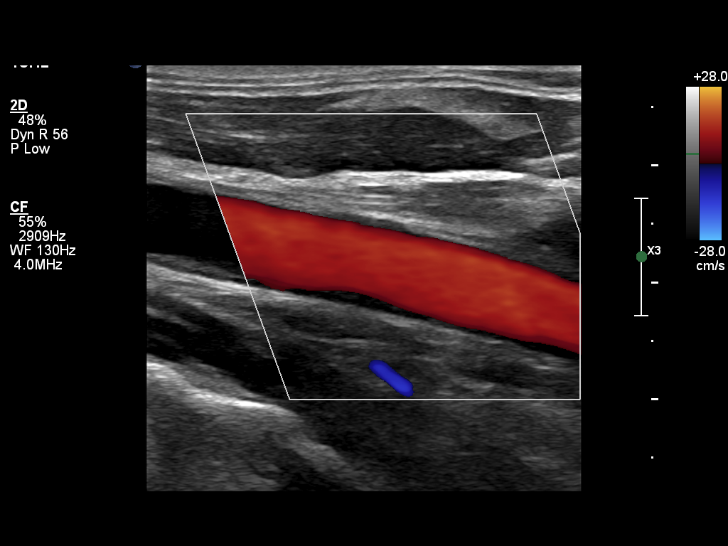
[im 38/64]
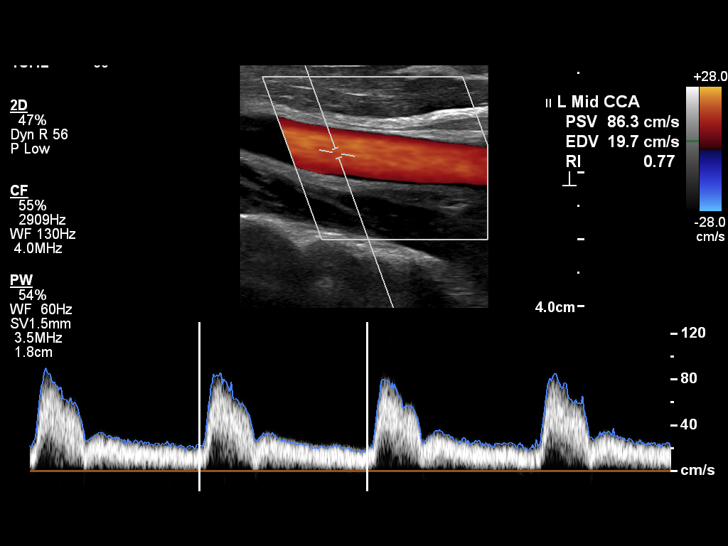
[im 43/64]
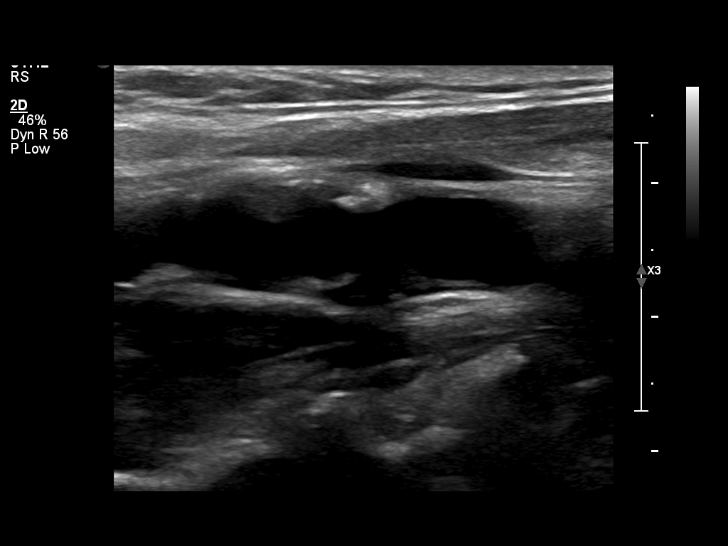
[im 51/64]
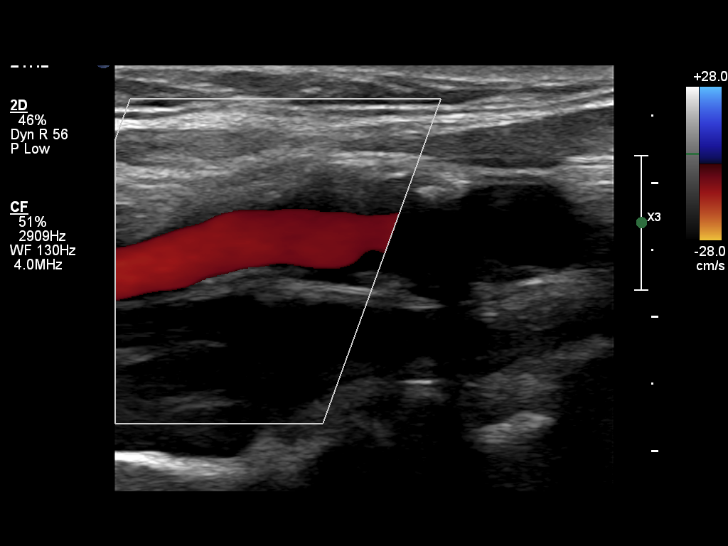
[im 55/64]
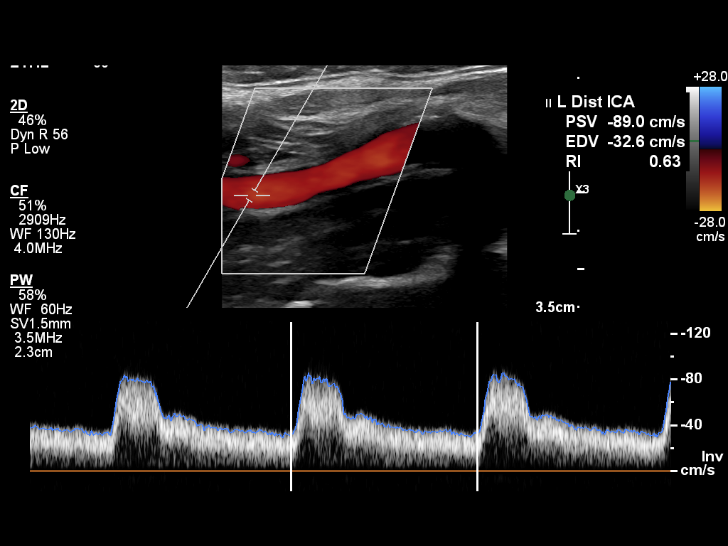
[im 59/64]
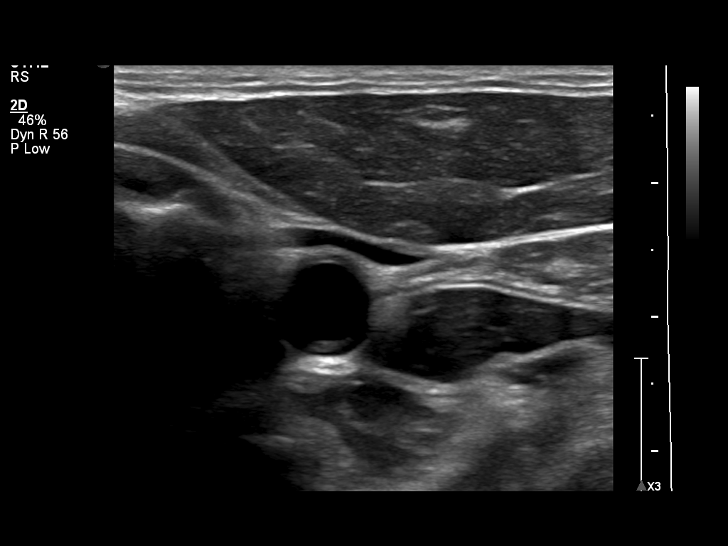
[im 64/64]
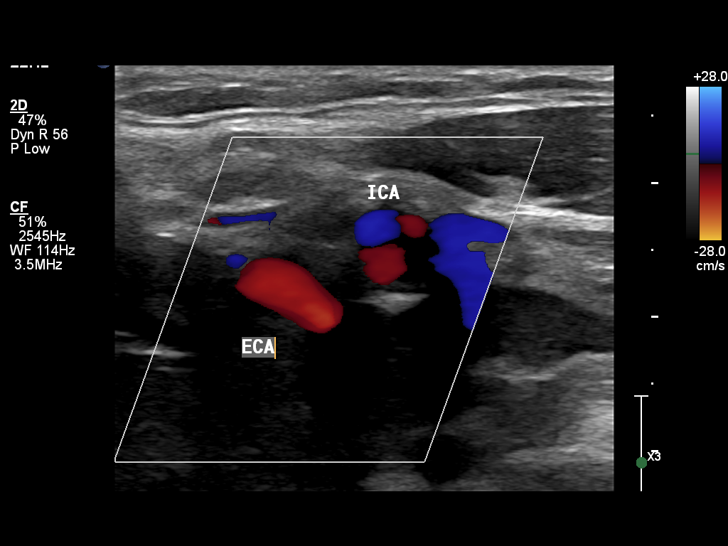

[14 of 16 positions shown; findings below may reference images not displayed]

DIAGNOSTIC STUDIES

EXAM

Carotid Doppler ultrasound.

INDICATION

Calcification on Recent Imaging, Hx of CAD
calcification seen on recent xray; hx of CAD; hx of HTN

TECHNIQUE

High-resolution duplex imaging of the accessible cervical carotid arteries was obtained. This
includes color, Doppler, and spectral analysis.

COMPARISONS

None available

FINDINGS

Right:
Peak systolic velocity in the distal right common carotid artery is 0.88 meters/seconds.
The right external carotid artery is normal.
The right internal carotid artery demonstrates peak systolic velocity of 0.8 meters/second.
Antegrade flow is identified the right vertebral.

Left:
Peak systolic velocity in the distal left common carotid artery is 0.81 meters/second.
The left external carotid artery is normal.
The left internal carotid artery demonstrates peak systolic velocity of 0.89 meters/seconds.
Antegrade flow is identified the left vertebral.

IMPRESSION

No significant stenosis throughout either carotid artery.

Tech Notes:

calcification seen on recent xray; hx of CAD; hx of HTN

## 2023-09-01 ENCOUNTER — Encounter: Admit: 2023-09-01 | Discharge: 2023-09-01 | Payer: BLUE CROSS/BLUE SHIELD

## 2023-09-01 DIAGNOSIS — I10 Essential (primary) hypertension: Secondary | ICD-10-CM

## 2023-09-01 DIAGNOSIS — I493 Ventricular premature depolarization: Secondary | ICD-10-CM

## 2023-09-01 DIAGNOSIS — E782 Mixed hyperlipidemia: Secondary | ICD-10-CM

## 2023-09-01 DIAGNOSIS — Z951 Presence of aortocoronary bypass graft: Secondary | ICD-10-CM

## 2023-09-01 LAB — BASIC METABOLIC PANEL
ANION GAP: 9
BLD UREA NITROGEN: 20
CALCIUM: 9.5
CHLORIDE: 108 — ABNORMAL HIGH (ref 98–107)
CO2: 21 — ABNORMAL LOW (ref 23–31)
CREATININE: 1.3 — ABNORMAL HIGH (ref 0.72–1.25)
GLUCOSE,PANEL: 132 — ABNORMAL HIGH (ref 70–105)
POTASSIUM: 4.1

## 2023-09-01 LAB — LIPID PROFILE
CHOLESTEROL: 157
HDL: 30 — ABNORMAL LOW (ref >=40)
TRIGLYCERIDES: 188 — ABNORMAL HIGH (ref <150)

## 2023-09-01 LAB — CBC
HEMATOCRIT: 42
HEMOGLOBIN: 14
MCH: 30
MCHC: 33
MCV: 89
MPV: 8.9 — ABNORMAL LOW (ref 9.4–12.4)
PLATELET COUNT: 182
RBC COUNT: 4.8
WBC COUNT: 5.2

## 2023-09-09 ENCOUNTER — Encounter: Admit: 2023-09-09 | Discharge: 2023-09-09 | Payer: BLUE CROSS/BLUE SHIELD

## 2023-09-10 ENCOUNTER — Encounter: Admit: 2023-09-10 | Discharge: 2023-09-10 | Payer: BLUE CROSS/BLUE SHIELD

## 2023-09-13 ENCOUNTER — Encounter: Admit: 2023-09-13 | Discharge: 2023-09-13 | Payer: BLUE CROSS/BLUE SHIELD

## 2023-09-15 ENCOUNTER — Encounter: Admit: 2023-09-15 | Discharge: 2023-09-15 | Payer: BLUE CROSS/BLUE SHIELD

## 2023-09-16 ENCOUNTER — Encounter: Admit: 2023-09-16 | Discharge: 2023-09-16 | Payer: BLUE CROSS/BLUE SHIELD

## 2023-09-16 NOTE — Progress Notes
 Pharmacy Benefits Investigation    Medication name: evolocumab (REPATHA SURECLICK) 140 mg/mL injectable PEN  Medication status: new    The insurance requires a prior authorization for the medication. The prior authorization was submitted via CoverMyMeds.    PA number: [facility]    Thurnell Floss  Specialty Pharmacy Patient Advocate

## 2023-09-16 NOTE — Telephone Encounter
 09/16/2023 5:17 PM     Pt called in with phone number for [facility] [phone].  Pt states he was told if the doctor calls this number it will help speed up the Repatha approval process.    Appears Repatha ordered by Dr. Venice Gillis on 09/09/23.  PA was submitted 09/16/23 by [facility], B. Colbert, per notes.   Will await response to PA before contacting this phone number.

## 2023-09-18 ENCOUNTER — Encounter: Admit: 2023-09-18 | Discharge: 2023-09-18 | Payer: BLUE CROSS/BLUE SHIELD

## 2023-09-19 ENCOUNTER — Encounter: Admit: 2023-09-19 | Discharge: 2023-09-19 | Payer: BLUE CROSS/BLUE SHIELD

## 2023-09-21 ENCOUNTER — Encounter: Admit: 2023-09-21 | Discharge: 2023-09-21 | Payer: BLUE CROSS/BLUE SHIELD

## 2023-09-22 MED FILL — REPATHA SURECLICK 140 MG/ML SC PNIJ: 140 mg/mL | SUBCUTANEOUS | 28 days supply | Qty: 2 | Fill #1 | Status: AC

## 2023-09-23 ENCOUNTER — Encounter: Admit: 2023-09-23 | Discharge: 2023-09-23 | Payer: BLUE CROSS/BLUE SHIELD

## 2023-09-23 NOTE — Progress Notes
 Pharmacy Benefits Investigation    Medication name: evolocumab (REPATHA SURECLICK) 140 mg/mL injectable PEN  Medication status: new    The prior authorization was approved for Brian Peterson (PA number unknown) from n/a (no specified start date) through 09/16/2024.    The out of pocket cost today is $75 for 28 days. This cost may change due to factors including but not limited to changes in insurance coverage.    A copay card was obtained and will provide the patient with up to $2500 per 12 months.    After assistance, the final out of pocket cost is $15.    The patient previously stated this copay is affordable. The patient is not due for a refill at this time, the prescription will be filled through the normal refill process.  Alexia Angelucci  Specialty Pharmacy Patient Advocate

## 2023-10-02 ENCOUNTER — Encounter: Admit: 2023-10-02 | Discharge: 2023-10-02 | Payer: BLUE CROSS/BLUE SHIELD

## 2023-10-09 ENCOUNTER — Encounter: Admit: 2023-10-09 | Discharge: 2023-10-09 | Payer: BLUE CROSS/BLUE SHIELD

## 2023-10-10 ENCOUNTER — Encounter: Admit: 2023-10-10 | Discharge: 2023-10-10 | Payer: BLUE CROSS/BLUE SHIELD

## 2023-10-13 ENCOUNTER — Encounter: Admit: 2023-10-13 | Discharge: 2023-10-13 | Payer: BLUE CROSS/BLUE SHIELD

## 2023-10-13 MED FILL — REPATHA SURECLICK 140 MG/ML SC PNIJ: 140 mg/mL | SUBCUTANEOUS | 28 days supply | Qty: 2 | Fill #2 | Status: AC

## 2023-11-07 ENCOUNTER — Encounter: Admit: 2023-11-07 | Discharge: 2023-11-07 | Payer: BLUE CROSS/BLUE SHIELD

## 2023-11-08 ENCOUNTER — Encounter: Admit: 2023-11-08 | Discharge: 2023-11-08 | Payer: BLUE CROSS/BLUE SHIELD

## 2023-11-10 ENCOUNTER — Encounter: Admit: 2023-11-10 | Discharge: 2023-11-10 | Payer: BLUE CROSS/BLUE SHIELD

## 2023-11-10 MED FILL — REPATHA SURECLICK 140 MG/ML SC PNIJ: 140 mg/mL | SUBCUTANEOUS | 28 days supply | Qty: 2 | Fill #1 | Status: AC

## 2023-12-07 ENCOUNTER — Encounter: Admit: 2023-12-07 | Discharge: 2023-12-07 | Payer: BLUE CROSS/BLUE SHIELD

## 2023-12-08 ENCOUNTER — Encounter: Admit: 2023-12-08 | Discharge: 2023-12-08 | Payer: BLUE CROSS/BLUE SHIELD

## 2023-12-10 ENCOUNTER — Encounter: Admit: 2023-12-10 | Discharge: 2023-12-10 | Payer: BLUE CROSS/BLUE SHIELD

## 2023-12-12 ENCOUNTER — Encounter: Admit: 2023-12-12 | Discharge: 2023-12-12 | Payer: BLUE CROSS/BLUE SHIELD

## 2023-12-14 ENCOUNTER — Encounter: Admit: 2023-12-14 | Discharge: 2023-12-14 | Payer: BLUE CROSS/BLUE SHIELD

## 2023-12-15 ENCOUNTER — Encounter: Admit: 2023-12-15 | Discharge: 2023-12-15 | Payer: BLUE CROSS/BLUE SHIELD

## 2023-12-15 MED FILL — REPATHA SURECLICK 140 MG/ML SC PNIJ: 140 mg/mL | SUBCUTANEOUS | 28 days supply | Qty: 2 | Fill #0 | Status: AC

## 2023-12-29 ENCOUNTER — Encounter: Admit: 2023-12-29 | Discharge: 2023-12-29 | Payer: BLUE CROSS/BLUE SHIELD

## 2024-01-05 ENCOUNTER — Encounter: Admit: 2024-01-05 | Discharge: 2024-01-05 | Payer: BLUE CROSS/BLUE SHIELD

## 2024-01-06 ENCOUNTER — Encounter: Admit: 2024-01-06 | Discharge: 2024-01-06 | Payer: BLUE CROSS/BLUE SHIELD

## 2024-01-07 MED FILL — REPATHA SURECLICK 140 MG/ML SC PNIJ: 140 mg/mL | SUBCUTANEOUS | 28 days supply | Qty: 2 | Fill #1 | Status: AC

## 2024-01-27 ENCOUNTER — Ambulatory Visit: Admit: 2024-01-27 | Discharge: 2024-01-28 | Payer: BLUE CROSS/BLUE SHIELD

## 2024-01-27 ENCOUNTER — Encounter: Admit: 2024-01-27 | Discharge: 2024-01-27 | Payer: BLUE CROSS/BLUE SHIELD

## 2024-01-27 DIAGNOSIS — I2510 Atherosclerotic heart disease of native coronary artery without angina pectoris: Secondary | ICD-10-CM

## 2024-01-27 DIAGNOSIS — I493 Ventricular premature depolarization: Secondary | ICD-10-CM

## 2024-01-27 DIAGNOSIS — E7841 Elevated Lipoprotein(a): Secondary | ICD-10-CM

## 2024-01-27 DIAGNOSIS — Z951 Presence of aortocoronary bypass graft: Secondary | ICD-10-CM

## 2024-01-27 DIAGNOSIS — E782 Mixed hyperlipidemia: Secondary | ICD-10-CM

## 2024-01-28 DIAGNOSIS — I10 Essential (primary) hypertension: Principal | ICD-10-CM

## 2024-01-29 ENCOUNTER — Encounter: Admit: 2024-01-29 | Discharge: 2024-01-29 | Payer: BLUE CROSS/BLUE SHIELD

## 2024-01-29 DIAGNOSIS — I10 Essential (primary) hypertension: Principal | ICD-10-CM

## 2024-01-29 DIAGNOSIS — E7841 Elevated Lipoprotein(a): Secondary | ICD-10-CM

## 2024-01-29 LAB — LIPID PROFILE
CHOLESTEROL/HDL %: 2
CHOLESTEROL: 60
HDL: 29 — ABNORMAL LOW
LDL: 7
TRIGLYCERIDES: 120
VLDL: 24

## 2024-02-02 ENCOUNTER — Encounter: Admit: 2024-02-02 | Discharge: 2024-02-02 | Payer: BLUE CROSS/BLUE SHIELD

## 2024-02-04 ENCOUNTER — Encounter: Admit: 2024-02-04 | Discharge: 2024-02-04 | Payer: BLUE CROSS/BLUE SHIELD

## 2024-02-05 ENCOUNTER — Encounter: Admit: 2024-02-05 | Discharge: 2024-02-05 | Payer: BLUE CROSS/BLUE SHIELD

## 2024-02-05 MED FILL — REPATHA SURECLICK 140 MG/ML SC PNIJ: 140 mg/mL | SUBCUTANEOUS | 28 days supply | Qty: 2 | Fill #2 | Status: AC

## 2024-02-27 ENCOUNTER — Encounter: Admit: 2024-02-27 | Discharge: 2024-02-27 | Payer: BLUE CROSS/BLUE SHIELD

## 2024-02-28 ENCOUNTER — Encounter: Admit: 2024-02-28 | Discharge: 2024-02-28 | Payer: BLUE CROSS/BLUE SHIELD

## 2024-02-29 ENCOUNTER — Encounter: Admit: 2024-02-29 | Discharge: 2024-02-29 | Payer: BLUE CROSS/BLUE SHIELD

## 2024-02-29 MED FILL — REPATHA SURECLICK 140 MG/ML SC PNIJ: 140 mg/mL | SUBCUTANEOUS | 28 days supply | Qty: 2 | Fill #3 | Status: AC

## 2024-03-29 ENCOUNTER — Encounter: Admit: 2024-03-29 | Discharge: 2024-03-29 | Payer: BLUE CROSS/BLUE SHIELD

## 2024-03-30 ENCOUNTER — Encounter: Admit: 2024-03-30 | Discharge: 2024-03-30 | Payer: BLUE CROSS/BLUE SHIELD

## 2024-03-31 MED FILL — REPATHA SURECLICK 140 MG/ML SC PNIJ: 140 mg/mL | SUBCUTANEOUS | 28 days supply | Qty: 2 | Fill #4 | Status: AC

## 2024-04-22 ENCOUNTER — Encounter: Admit: 2024-04-22 | Discharge: 2024-04-22 | Payer: BLUE CROSS/BLUE SHIELD

## 2024-04-23 ENCOUNTER — Encounter: Admit: 2024-04-23 | Discharge: 2024-04-23 | Payer: BLUE CROSS/BLUE SHIELD

## 2024-04-25 ENCOUNTER — Encounter: Admit: 2024-04-25 | Discharge: 2024-04-25 | Payer: BLUE CROSS/BLUE SHIELD

## 2024-04-26 MED FILL — REPATHA SURECLICK 140 MG/ML SC PNIJ: 140 mg/mL | SUBCUTANEOUS | 28 days supply | Qty: 2 | Fill #5 | Status: AC

## 2024-05-22 ENCOUNTER — Encounter: Admit: 2024-05-22 | Discharge: 2024-05-22 | Payer: BLUE CROSS/BLUE SHIELD

## 2024-05-24 ENCOUNTER — Encounter: Admit: 2024-05-24 | Discharge: 2024-05-24 | Payer: BLUE CROSS/BLUE SHIELD

## 2024-05-25 ENCOUNTER — Encounter: Admit: 2024-05-25 | Discharge: 2024-05-25 | Payer: BLUE CROSS/BLUE SHIELD

## 2024-05-25 MED FILL — REPATHA SURECLICK 140 MG/ML SC PNIJ: 140 mg/mL | SUBCUTANEOUS | 28 days supply | Qty: 2 | Fill #6 | Status: AC

## 2024-05-28 ENCOUNTER — Encounter: Admit: 2024-05-28 | Discharge: 2024-05-28 | Payer: BLUE CROSS/BLUE SHIELD

## 2024-06-18 ENCOUNTER — Encounter: Admit: 2024-06-18 | Discharge: 2024-06-18 | Payer: BLUE CROSS/BLUE SHIELD

## 2024-06-22 ENCOUNTER — Encounter: Admit: 2024-06-22 | Discharge: 2024-06-22 | Payer: BLUE CROSS/BLUE SHIELD

## 2024-06-22 MED FILL — REPATHA SURECLICK 140 MG/ML SC PNIJ: 140 mg/mL | SUBCUTANEOUS | 28 days supply | Qty: 2 | Fill #7 | Status: AC
